# Patient Record
Sex: Male | Born: 1965 | Race: White | Hispanic: No | Marital: Married | Smoking: Never smoker
Health system: Southern US, Community
[De-identification: ages and names within clinical notes are randomized; demographics above are authoritative.]

## PROBLEM LIST (undated history)

## (undated) DIAGNOSIS — I10 Essential (primary) hypertension: Secondary | ICD-10-CM

## (undated) DIAGNOSIS — E119 Type 2 diabetes mellitus without complications: Secondary | ICD-10-CM

## (undated) DIAGNOSIS — F319 Bipolar disorder, unspecified: Secondary | ICD-10-CM

## (undated) DIAGNOSIS — E1122 Type 2 diabetes mellitus with diabetic chronic kidney disease: Secondary | ICD-10-CM

## (undated) DIAGNOSIS — E785 Hyperlipidemia, unspecified: Secondary | ICD-10-CM

## (undated) HISTORY — DX: Type 2 diabetes mellitus without complications: E11.9

## (undated) HISTORY — DX: Type 2 diabetes mellitus with diabetic chronic kidney disease: E11.22

## (undated) HISTORY — DX: Bipolar disorder, unspecified: F31.9

## (undated) HISTORY — DX: Hyperlipidemia, unspecified: E78.5

## (undated) HISTORY — DX: Essential (primary) hypertension: I10

---

## 2006-10-17 ENCOUNTER — Encounter: Admission: RE | Admit: 2006-10-17 | Discharge: 2006-10-17 | Payer: Self-pay | Admitting: Gastroenterology

## 2016-04-05 ENCOUNTER — Encounter (INDEPENDENT_AMBULATORY_CARE_PROVIDER_SITE_OTHER): Payer: Managed Care, Other (non HMO) | Admitting: Ophthalmology

## 2016-04-05 DIAGNOSIS — H2513 Age-related nuclear cataract, bilateral: Secondary | ICD-10-CM

## 2016-04-05 DIAGNOSIS — H35033 Hypertensive retinopathy, bilateral: Secondary | ICD-10-CM | POA: Diagnosis not present

## 2016-04-05 DIAGNOSIS — H43813 Vitreous degeneration, bilateral: Secondary | ICD-10-CM

## 2016-04-05 DIAGNOSIS — I10 Essential (primary) hypertension: Secondary | ICD-10-CM

## 2017-07-08 ENCOUNTER — Encounter: Payer: Managed Care, Other (non HMO) | Attending: Family Medicine | Admitting: Registered"

## 2017-07-08 ENCOUNTER — Encounter: Payer: Self-pay | Admitting: Registered"

## 2017-07-08 DIAGNOSIS — E118 Type 2 diabetes mellitus with unspecified complications: Secondary | ICD-10-CM

## 2017-07-08 DIAGNOSIS — K219 Gastro-esophageal reflux disease without esophagitis: Secondary | ICD-10-CM | POA: Diagnosis not present

## 2017-07-08 DIAGNOSIS — Z713 Dietary counseling and surveillance: Secondary | ICD-10-CM | POA: Diagnosis not present

## 2017-07-08 DIAGNOSIS — E1165 Type 2 diabetes mellitus with hyperglycemia: Secondary | ICD-10-CM | POA: Diagnosis not present

## 2017-07-08 DIAGNOSIS — E785 Hyperlipidemia, unspecified: Secondary | ICD-10-CM | POA: Insufficient documentation

## 2017-07-08 DIAGNOSIS — I10 Essential (primary) hypertension: Secondary | ICD-10-CM | POA: Diagnosis not present

## 2017-07-08 NOTE — Patient Instructions (Addendum)
Regarding supplements you are taking to help give you energy:  Consider asking to have your B12 tested to see if you need to cut back on your vitamin B supplements.  Consider asking to have your vitamin D tested  Good work on changes you have made in your eating patterns such as reducing the amount of chips you are eating!  Plan:   Eat balanced meals and snacks and be sure to include protein when eating carbohydrates.  Aim for 3-5 Carb Choices per meal (45-75 grams)  Aim for 0-2 Carbs choices (0-30 grams) per snack if hungry   Include protein in moderation with your meals and snacks  Consider reading food labels for Total Carbohydrate   Consider increasing your activity  as tolerated with a goal of working up to 3-5 times per week for 30-45 min each time.  Consider checking blood sugar at alternate times per day, fasting and 2 hours after 1 meal per day. If you want to keep a closer eye on your blood sugar, you can consider looking into the [facility] continuous glucose monitoring. Bring your blood glucose log with you to your next visit with me as well as your other health care providers.  Continue taking medication as directed by MD

## 2017-07-08 NOTE — Progress Notes (Signed)
Diabetes Self-Management Education  Visit Type: First/Initial  Appt. Start Time: 1415 Appt. End Time: 1515  07/08/2017  Mr. Tereso Newcomerhomas Doiron, identified by name and date of birth, is a 51 y.o. male with a diagnosis of Diabetes: Type 2.   ASSESSMENT Pt is here for a refresher diabetes education. Pt states he remembers advice to eat whole grains, which he feels he aims to do. Per referral labs A1c was 8.6% March 2018, but is now 10.7% as of October 2018  Pt states several of the vitamins and supplements are to not only address the diabetes but also to increase energy. Pt states he has read the information for his medications and believes that is what is making him tired and taking supplements which he believes will counteract their effect.  Pt states his schedule is very busy taking care of his children's needs as well as his mother (in-law?) who has Alzheimer's. Pt states he eats very late dinners, usually take out, so he can eat with his children when they get home, sometimes not eating dinner until 11 pm and then stay up until 1 am.  Pt states he was checking his BG several years ago but stopped checking because he felt good and believed that as long as he felt good his blood sugar was fine. RD plans to go over symptoms of hypoglycemia and potentially not feeling effects of the hyperglycemia.  Areas which RD would like to cover in subsequent visit include effect of sleep & stress on BG as well as his lipid panel results: Chol: 217 HDL 36 TG 640 NHDL 182   Diabetes Self-Management Education - 07/08/17 1433      Visit Information   Visit Type  First/Initial      Initial Visit   Diabetes Type  Type 2    Are you currently following a meal plan?  Yes    What type of meal plan do you follow?  reduced carbs    Are you taking your medications as prescribed?  Yes metformin, pt states causes heartburn; saxagliptin   metformin, pt states causes heartburn; saxagliptin   Date Diagnosed  5-6 yrs  ago      Health Coping   How would you rate your overall health?  Good      Psychosocial Assessment   Patient Belief/Attitude about Diabetes  Other (comment) frustrated   frustrated   How often do you need to have someone help you when you read instructions, pamphlets, or other written materials from your doctor or pharmacy?  --    What is the last grade level you completed in school?  BME (music education)      Complications   Last HgB A1C per patient/outside source  10.7 % per referral labs 05/2017   per referral labs 05/2017   How often do you check your blood sugar?  0 times/day (not testing)    Have you had a dilated eye exam in the past 12 months?  Yes    Have you had a dental exam in the past 12 months?  Yes    Are you checking your feet?  Yes    How many days per week are you checking your feet?  7      Dietary Intake   Breakfast  protein shake 1 g cho/30 g pro    Snack (morning)  fruit OR nabs whole grain OR nuts OR chex mix  10   10   Lunch  left overs OR sandwich  lean ham, cheese 12-3   12-3   Snack (afternoon)  same as earlier    Sara LeeDinner  take out (was) cookout burgers OR chicken or tuna, OR eggs, meat, cheese, OR salmon patties, green beans, corn, peas OR fish sticks, mixed veggies OR [place] on Sundays 8-9 pm   8-9 pm   Beverage(s)  water with morning meds, diet soda      Exercise   Exercise Type  ADL's    How many days per week to you exercise?  0    How many minutes per day do you exercise?  0    Total minutes per week of exercise  0      Patient Education   Previous Diabetes Education  Yes (please comment) several months after diagnosis, RD within MD practice   several months after diagnosis, RD within MD practice   Nutrition management   Role of diet in the treatment of diabetes and the relationship between the three main macronutrients and blood glucose level;Carbohydrate counting;Food label reading, portion sizes and measuring food.    Physical activity and  exercise   Role of exercise on diabetes management, blood pressure control and cardiac health.    Monitoring  Identified appropriate SMBG and/or A1C goals.      Individualized Goals (developed by patient)   Nutrition  General guidelines for healthy choices and portions discussed    Physical Activity  Exercise 3-5 times per week    Monitoring   test my blood glucose as discussed      Outcomes   Expected Outcomes  Demonstrated interest in learning. Expect positive outcomes    Future DMSE  4-6 wks    Program Status  Not Completed     Individualized Plan for Diabetes Self-Management Training:   Learning Objective:  Patient will have a greater understanding of diabetes self-management. Patient education plan is to attend individual and/or group sessions per assessed needs and concerns.  Patient Instructions  Regarding supplements you are taking to help give you energy:  Consider asking to have your B12 tested to see if you need to cut back on your vitamin B supplements.  Consider asking to have your vitamin D tested  Good work on changes you have made in your eating patterns such as reducing the amount of chips you are eating!  Plan:   Eat balanced meals and snacks and be sure to include protein when eating carbohydrates.  Aim for 3-5 Carb Choices per meal (45-75 grams)  Aim for 0-2 Carbs choices (0-30 grams) per snack if hungry   Include protein in moderation with your meals and snacks  Consider reading food labels for Total Carbohydrate   Consider increasing your activity  as tolerated with a goal of working up to 3-5 times per week for 30-45 min each time.  Consider checking blood sugar at alternate times per day, fasting and 2 hours after 1 meal per day. If you want to keep a closer eye on your blood sugar, you can consider looking into the [facility] continuous glucose monitoring. Bring your blood glucose log with you to your next visit with me as well as your other  health care providers.  Continue taking medication as directed by MD  Expected Outcomes:  Demonstrated interest in learning. Expect positive outcomes  Education material provided: Living Well with Diabetes, Snack sheet and Carbohydrate counting sheet  If problems or questions, patient to contact team via:  Phone  Future DSME appointment: 4-6 wks

## 2017-08-12 ENCOUNTER — Encounter: Payer: Managed Care, Other (non HMO) | Attending: Family Medicine | Admitting: Registered"

## 2017-08-12 DIAGNOSIS — Z713 Dietary counseling and surveillance: Secondary | ICD-10-CM | POA: Insufficient documentation

## 2017-08-12 DIAGNOSIS — I10 Essential (primary) hypertension: Secondary | ICD-10-CM | POA: Insufficient documentation

## 2017-08-12 DIAGNOSIS — E1165 Type 2 diabetes mellitus with hyperglycemia: Secondary | ICD-10-CM | POA: Insufficient documentation

## 2017-08-12 DIAGNOSIS — K219 Gastro-esophageal reflux disease without esophagitis: Secondary | ICD-10-CM | POA: Insufficient documentation

## 2017-08-12 DIAGNOSIS — E785 Hyperlipidemia, unspecified: Secondary | ICD-10-CM | POA: Insufficient documentation

## 2017-08-12 NOTE — Progress Notes (Signed)
Diabetes Self-Management Education  Visit Type: Follow-up  Appt. Start Time: 0935 Appt. End Time: 1015  08/12/2017  [name], identified by name and date of birth, is a 51 y.o. male with a diagnosis of Diabetes:  .   ASSESSMENT Patient returns after starting to check blood sugar. 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 Patient states he is not interested in the CGM at this time because he knows his large dogs will knock it off.  Patient states his numbers are often within target range and would like to understand what is causing some of the elevated morning numbers.   Patient states it is difficult to get adequate sleep because of his dogs who cannot go much more than 5 hours without being walked to be relieved. He usually takes them out around midnight.   Diabetes Self-Management Education - 08/12/17 0900      Visit Information   Visit Type  Follow-up      Complications   How often do you check your blood sugar?  3-4 times/day    Fasting Blood glucose range (mg/dL)  69-629;528-413;[phone];130-179;180-200    Postprandial Blood glucose range (mg/dL)  [phone]    Number of hypoglycemic episodes per month  0    Number of hyperglycemic episodes per week  1      Dietary Intake   Breakfast  protein shake    Snack (morning)  chex mix    Lunch  sandwich, ww bread, lean ham, cheese    Dinner  chicken tenders, collard greens, sweet potatoes    Beverage(s)  water, diet soda      Exercise   Exercise Type  ADL's    How many days per week to you exercise?  0    How many minutes per day do you exercise?  0    Total minutes per week of exercise  0      Patient Education   Nutrition management   Role of diet in the treatment of diabetes and the relationship between the three main macronutrients and blood glucose level    Monitoring  Purpose and frequency of SMBG.      Individualized Goals (developed by patient)   Nutrition  General guidelines for healthy choices and portions discussed    Reducing Risk  increase portions of nuts and seeds;Other (comment) aim to get more sleep      Outcomes   Expected Outcomes  Demonstrated interest in learning. Expect positive outcomes    Future DMSE  PRN    Program Status  Completed      Subsequent Visit   Since your last visit have you continued or begun to take your medications as prescribed?  Yes    Since your last visit have you experienced any weight changes?  Loss    Weight Loss (lbs)  6    Since your last visit, are you checking your blood glucose at least once a day?  Yes     Individualized Plan for Diabetes Self-Management Training:   Learning Objective:  Patient will have a greater understanding of diabetes self-management. Patient education plan is to attend individual and/or group sessions per assessed needs and concerns.   Patient Instructions  You don't need to stay up late to check blood sugar. Aim to get as much sleep as possible Read the article about morning high numbers Consider having less corn/beans/peas with your dinner when  also having bread. Aim to check your blood sugar 2 hours after your first bite.  Regarding supplements you are taking to help give you energy:  Consider asking to have your B12 tested to see if you need to cut back on your vitamin B supplements.  Consider asking to have your vitamin D tested  Expected Outcomes:  Demonstrated interest in learning. Expect positive outcomes  Education material provided: Types of Fat, Cholesterol & Triglycerides, Sleep Hygiene, Causes of Morning High BG  If problems or questions, patient to contact team via:  Phone  Future DSME appointment: PRN

## 2017-08-12 NOTE — Patient Instructions (Addendum)
You don't need to stay up late to check blood sugar. Aim to get as much sleep as possible Read the article about morning high numbers Consider having less corn/beans/peas with your dinner when also having bread. Aim to check your blood sugar 2 hours after your first bite.  Regarding supplements you are taking to help give you energy:  Consider asking to have your B12 tested to see if you need to cut back on your vitamin B supplements.  Consider asking to have your vitamin D tested

## 2017-08-23 ENCOUNTER — Ambulatory Visit: Payer: Managed Care, Other (non HMO) | Admitting: Registered"

## 2017-08-26 ENCOUNTER — Encounter: Payer: Self-pay | Admitting: Registered"

## 2017-08-26 ENCOUNTER — Encounter: Payer: Managed Care, Other (non HMO) | Admitting: Registered"

## 2017-08-26 DIAGNOSIS — I10 Essential (primary) hypertension: Secondary | ICD-10-CM | POA: Diagnosis not present

## 2017-08-26 DIAGNOSIS — Z713 Dietary counseling and surveillance: Secondary | ICD-10-CM | POA: Diagnosis not present

## 2017-08-26 DIAGNOSIS — E785 Hyperlipidemia, unspecified: Secondary | ICD-10-CM | POA: Diagnosis not present

## 2017-08-26 DIAGNOSIS — E1165 Type 2 diabetes mellitus with hyperglycemia: Secondary | ICD-10-CM | POA: Diagnosis present

## 2017-08-26 DIAGNOSIS — E118 Type 2 diabetes mellitus with unspecified complications: Secondary | ICD-10-CM

## 2017-08-26 DIAGNOSIS — K219 Gastro-esophageal reflux disease without esophagitis: Secondary | ICD-10-CM | POA: Diagnosis not present

## 2017-08-26 NOTE — Progress Notes (Signed)
Diabetes Self-Management Education  Visit Type:  Follow up  Appt. Start Time: 1345 Appt. End Time: 1420  08/26/2017  Mr. Tereso Newcomerhomas Mihelich, identified by name and date of birth, is a 51 y.o. male with a diagnosis of Diabetes:  .   ASSESSMENT Patient returns for DSME. Patient provided BG log, taking BG 2x day morning FBG 173-180; 2h PPBG 173-229. Pt states he gets hungry around 10:30, about 3 hrs after breakfast, but if busy may not eat and sometimes will get shaky. Patient reports he does not check his BG because he doesn't want to check more than 2x day. Pt has reported that he is not interested in CGM because his large dogs would knock it off.   Br: protein shake Sn: none or something quick such as chex mix, nuts, or chips Lunch tilapia, guacamole salad, ~10 chips & salsa S: none Dinner: 1 1/2 c pasta, Hawaiian roll, develled egg (a little later) ice cream  Individualized Plan for Diabetes Self-Management Training:  Learning Objective:  Patient will have a greater understanding of diabetes self-management. Patient education plan is to attend individual and/or group sessions per assessed needs and concerns.   Patient Instructions  Quick snack ideas: Hard Boiled eggs, protein packs, greek yogurt. Change when you are checking your blood sugar: Before a meal and then 2 hours after a meal. Find meal combinations that have less 50 mg/dL blood sugar increase. Aim to have regular exercise to help manage stress and blood sugar  Education material provided: Hypoglycemia symptoms/treatment, hyper-hypo symptom overlap  If problems or questions, patient to contact team via:  Phone  Future DSME appointment:  PRN

## 2017-08-26 NOTE — Patient Instructions (Addendum)
Quick snack ideas: Hard Boiled eggs, protein packs, greek yogurt. Change when you are checking your blood sugar: Before a meal and then 2 hours after a meal. Find meal combinations that have less 50 mg/dL blood sugar increase. Aim to have regular exercise to help manage stress and blood sugar

## 2018-06-24 ENCOUNTER — Encounter: Payer: Self-pay | Admitting: Emergency Medicine

## 2018-11-04 ENCOUNTER — Other Ambulatory Visit: Payer: Self-pay | Admitting: Nephrology

## 2018-11-04 DIAGNOSIS — N183 Chronic kidney disease, stage 3 unspecified: Secondary | ICD-10-CM

## 2018-11-13 ENCOUNTER — Ambulatory Visit
Admission: RE | Admit: 2018-11-13 | Discharge: 2018-11-13 | Disposition: A | Discharge status: Home / Self Care | Payer: Managed Care, Other (non HMO) | Source: Ambulatory Visit | Attending: Nephrology | Admitting: Nephrology

## 2018-11-13 DIAGNOSIS — N183 Chronic kidney disease, stage 3 unspecified: Secondary | ICD-10-CM

## 2018-11-24 ENCOUNTER — Other Ambulatory Visit: Payer: Self-pay | Admitting: Psychiatry

## 2018-11-25 ENCOUNTER — Telehealth: Payer: Self-pay | Admitting: Psychiatry

## 2018-11-25 NOTE — Telephone Encounter (Signed)
Already submitted to pharmacy

## 2018-11-25 NOTE — Telephone Encounter (Signed)
Pt called need refill Divalproex ER sent to [facility]

## 2018-11-25 NOTE — Telephone Encounter (Signed)
Need to review paper chart not seen in epic  

## 2019-05-11 ENCOUNTER — Other Ambulatory Visit: Payer: Self-pay | Admitting: Psychiatry

## 2019-05-18 ENCOUNTER — Ambulatory Visit (INDEPENDENT_AMBULATORY_CARE_PROVIDER_SITE_OTHER): Payer: Managed Care, Other (non HMO) | Admitting: Psychiatry

## 2019-05-18 ENCOUNTER — Encounter: Payer: Self-pay | Admitting: Psychiatry

## 2019-05-18 ENCOUNTER — Other Ambulatory Visit: Payer: Self-pay

## 2019-05-18 DIAGNOSIS — F319 Bipolar disorder, unspecified: Secondary | ICD-10-CM | POA: Diagnosis not present

## 2019-05-18 MED ORDER — DIVALPROEX SODIUM ER 500 MG PO TB24: 2000.0000 mg | ORAL_TABLET | Freq: Every day | ORAL | 3 refills | Status: DC

## 2019-05-18 NOTE — Progress Notes (Signed)
Adam Bowman [id number] [birth year] 53 y.o.  Subjective:   Patient ID:  Adam Bowman is a 53 y.o. (DOB 1966/07/24) male.  Chief Complaint:  Chief Complaint  Patient presents with  . Follow-up    Medication Management    HPI Adam Bowman presents to the office today for follow-up of bipolar disorder.  Doing fine with mood.  No illnesses. Remains stable on 2000 mg Depakote daily. No problems with the Depakote.  Wants to have daughters switch to Depakote from lithium bc he's in stage 4 CKD.   Patient reports stable mood and denies depressed or irritable moods.  Patient denies any recent difficulty with anxiety.  Patient denies difficulty with sleep initiation or maintenance except for physical sx. Denies appetite disturbance.  Patient reports that energy and motivation have been good.  Patient denies any difficulty with concentration.  Patient denies any suicidal ideation.  Only problems are kidneys and left arm pain.  Needs to see ortho.  Past Psychiatric Medication Trials:  Lithium, Depakote, perphenazine EPS, Wellbutrin  Review of Systems:  Review of Systems  Genitourinary:       Nocturia  Musculoskeletal: Positive for arthralgias and myalgias.  Neurological: Negative for tremors and weakness.    Medications: I have reviewed the patient's current medications.  Current Outpatient Medications  Medication Sig Dispense Refill  . Ascorbic Acid (VITAMIN C) 1000 MG tablet Take 3,000 mg 2 (two) times daily by mouth.    Marland Kitchen aspirin EC 81 MG tablet Take 81 mg daily by mouth.    Marland Kitchen atorvastatin (LIPITOR) 20 MG tablet Take 20 mg daily by mouth.    Marland Kitchen b complex vitamins tablet Take 1 tablet daily by mouth.    . BEE POLLEN PO Take by mouth.    . Cholecalciferol (VITAMIN D3) 2000 units TABS Take by mouth.    Marland Kitchen CINNAMON PO Take by mouth.    . Coenzyme Q10 (COQ-10 PO) Take by mouth.    . Cyanocobalamin (BL VITAMIN B-12 PO) Take by mouth.    . divalproex (DEPAKOTE ER) 500 MG 24 hr tablet TAKE  ONE TABLET BY MOUTH IN THE MORNING AND THREE TABLETS AT BEDTIME 120 tablet 0  . Fexofenadine HCl (ALLERGY 24-HR PO) Take by mouth.    . fluticasone (FLONASE) 50 MCG/ACT nasal spray Place into both nostrils daily.    Chilton Si Tea 250 MG CAPS Take 2 (two) times daily by mouth.    . GuaiFENesin (MUCINEX PO) Take by mouth daily.     Marland Kitchen JARDIANCE 25 MG TABS tablet 25 mg.    . KRILL OIL PO Take 2 (two) times daily by mouth.    . losartan (COZAAR) 50 MG tablet Take 50 mg daily by mouth.    . metFORMIN (GLUCOPHAGE) 500 MG tablet Take 1,000 mg by mouth 2 (two) times daily with a meal. 500 in the AM and 1000 in the evening.    . Misc Natural Products (JOINT SUPPORT COMPLEX PO) Take by mouth.    . Multiple Vitamin (MULTIVITAMIN WITH MINERALS) TABS tablet Take 1 tablet daily by mouth.    . multivitamin-lutein (OCUVITE-LUTEIN) CAPS capsule Take 1 capsule by mouth daily.    . Omega-3 Fatty Acids (FISH OIL) 1000 MG CAPS Take 3 capsules 2 (two) times daily by mouth.    . pantoprazole (PROTONIX) 40 MG tablet Take 40 mg daily by mouth.    Marland Kitchen Specialty Vitamins Products (PROSTATE PO) Take by mouth.    . TESTOSTERONE UNDECANOATE PO Take  2 tablets by mouth daily.     No current facility-administered medications for this visit.     Medication Side Effects: None  Allergies:  Allergies  Allergen Reactions  . Codeine Swelling    Face numbness   . Fenofibrate Swelling    Legs turn red and swell  . Ibuprofen     Stage 4 Kidney failure. Cannot take.  Marland Kitchen Procardia [Nifedipine] Swelling  . Sulfa Antibiotics Swelling    History reviewed. No pertinent past medical history.  History reviewed. No pertinent family history.  Social History   Socioeconomic History  . Marital status: Married    Spouse name: Not on file  . Number of children: Not on file  . Years of education: Not on file  . Highest education level: Not on file  Occupational History  . Not on file  Social Needs  . Financial resource strain:  Not on file  . Food insecurity    Worry: Not on file    Inability: Not on file  . Transportation needs    Medical: Not on file    Non-medical: Not on file  Tobacco Use  . Smoking status: Never Smoker  . Smokeless tobacco: Never Used  Substance and Sexual Activity  . Alcohol use: Not on file  . Drug use: Not on file  . Sexual activity: Not on file  Lifestyle  . Physical activity    Days per week: Not on file    Minutes per session: Not on file  . Stress: Not on file  Relationships  . Social Herbalist on phone: Not on file    Gets together: Not on file    Attends religious service: Not on file    Active member of club or organization: Not on file    Attends meetings of clubs or organizations: Not on file    Relationship status: Not on file  . Intimate partner violence    Fear of current or ex partner: Not on file    Emotionally abused: Not on file    Physically abused: Not on file    Forced sexual activity: Not on file  Other Topics Concern  . Not on file  Social History Narrative  . Not on file    Past Medical History, Surgical history, Social history, and Family history were reviewed and updated as appropriate.   Please see review of systems for further details on the patient's review from today.   Objective:   Physical Exam:  There were no vitals taken for this visit.  Physical Exam Constitutional:      General: He is not in acute distress.    Appearance: He is well-developed. He is obese.  Musculoskeletal:        General: No deformity.  Neurological:     Mental Status: He is alert and oriented to person, place, and time.     Coordination: Coordination normal.  Psychiatric:        Attention and Perception: Attention and perception normal. He does not perceive auditory or visual hallucinations.        Mood and Affect: Mood normal. Mood is not anxious or depressed. Affect is not labile, blunt, angry or inappropriate.        Speech: Speech normal.         Behavior: Behavior normal.        Thought Content: Thought content normal. Thought content does not include homicidal or suicidal ideation. Thought content does not include homicidal  or suicidal plan.        Cognition and Memory: Cognition and memory normal.        Judgment: Judgment normal.     Comments: Insight intact. No delusions.      Lab Review:  No results found for: NA, K, CL, CO2, GLUCOSE, BUN, CREATININE, CALCIUM, PROT, ALBUMIN, AST, ALT, ALKPHOS, BILITOT, GFRNONAA, GFRAA  No results found for: WBC, RBC, HGB, HCT, PLT, MCV, MCH, MCHC, RDW, LYMPHSABS, MONOABS, EOSABS, BASOSABS  No results found for: POCLITH, LITHIUM   No results found for: PHENYTOIN, PHENOBARB, VALPROATE, CBMZ   .res Assessment: Plan:    Maisie Fushomas was seen today for follow-up.  Diagnoses and all orders for this visit:  Bipolar I disorder (HCC)  supportive therapy for longterm stability.  Aware of high relapse risk off the meds.  He agrees to continue. Disc concerns about longterm lithium in the past contributing to his CKD.  Continue Depakote ER 500 mg in the AM and 1500 mg at night.  He agrees.   FU 1 year bc longterm stability.  Meredith Staggersarey Cottle, MD, DFAPA   Please see After Visit Summary for patient specific instructions.  No future appointments.  No orders of the defined types were placed in this encounter.   -------------------------------

## 2019-11-15 IMAGING — US RENAL/URINARY TRACT ULTRASOUND
1 series · 14 of 25 positions shown · non-contrast
Comparison: None.

CLINICAL DATA: 52-year-old male with chronic kidney disease

EXAM:
RENAL / URINARY TRACT ULTRASOUND COMPLETE

[Series 1: renal/urinary tract ultrasound · 0.22mm/px · 14 of 46 slices shown]
[im 1/46]
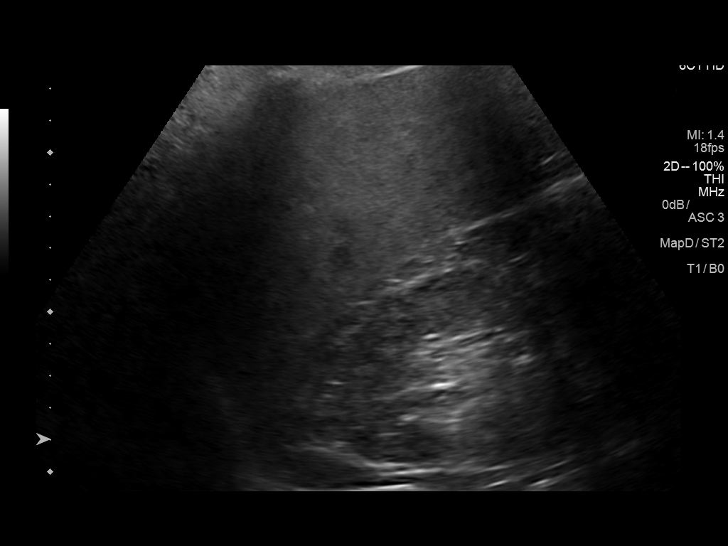
[im 4/46]
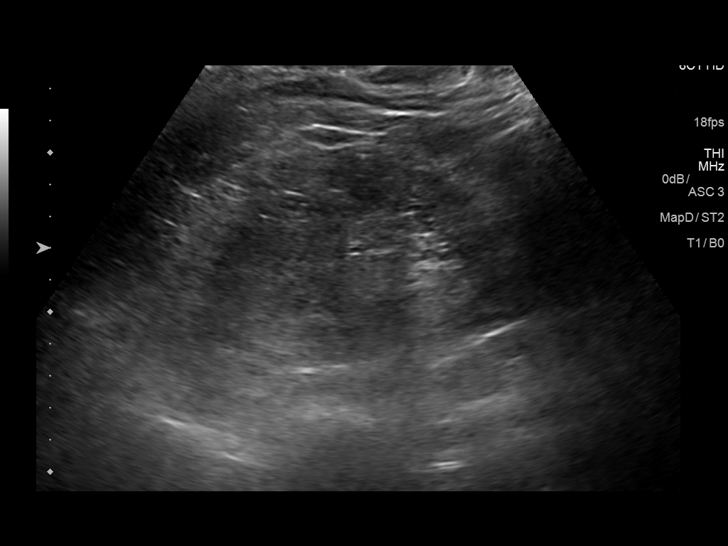
[im 8/46]
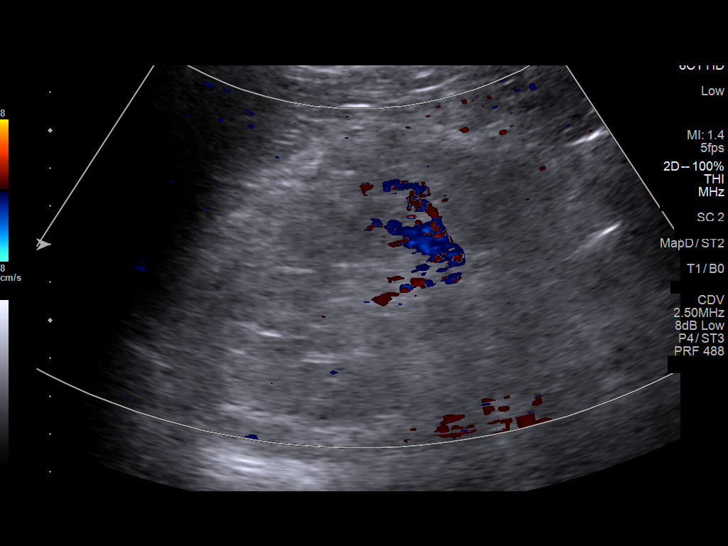
[im 12/46]
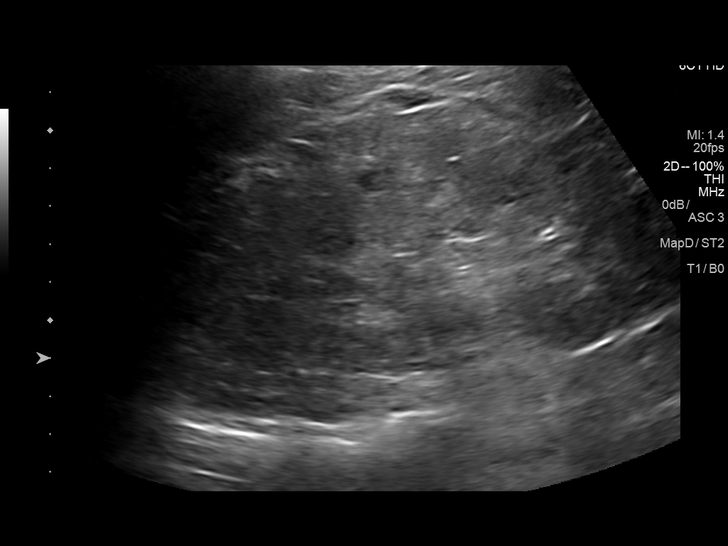
[im 16/46]
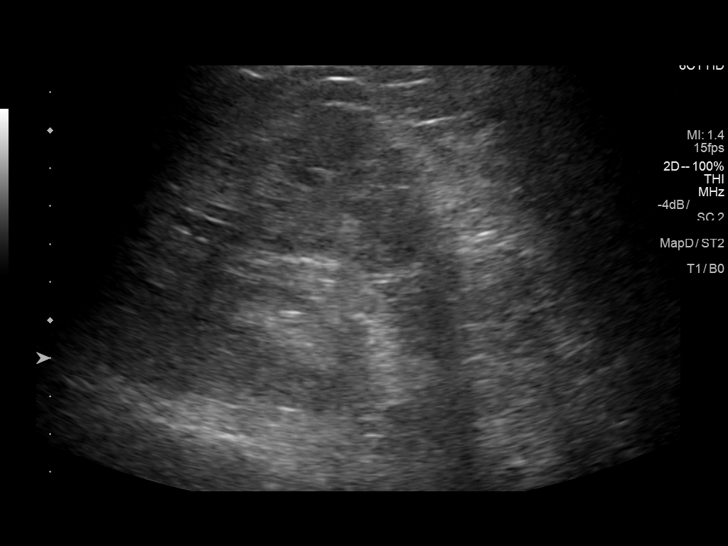
[im 17/46]
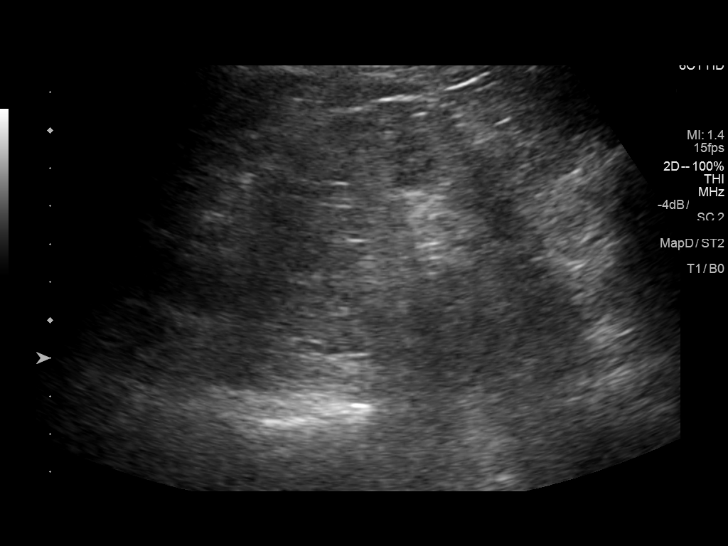
[im 21/46]
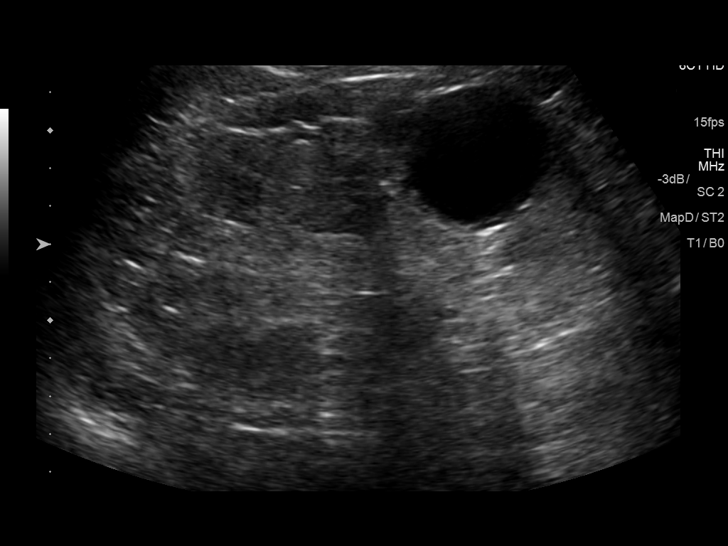
[im 25/46]
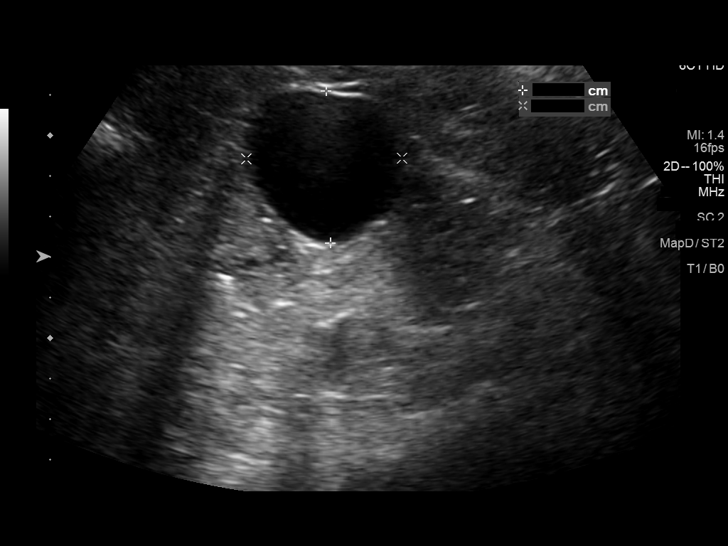
[im 29/46]
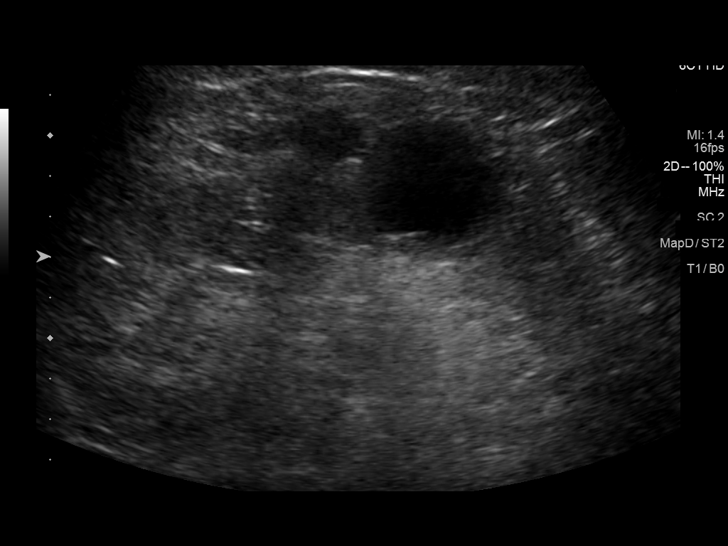
[im 31/46]
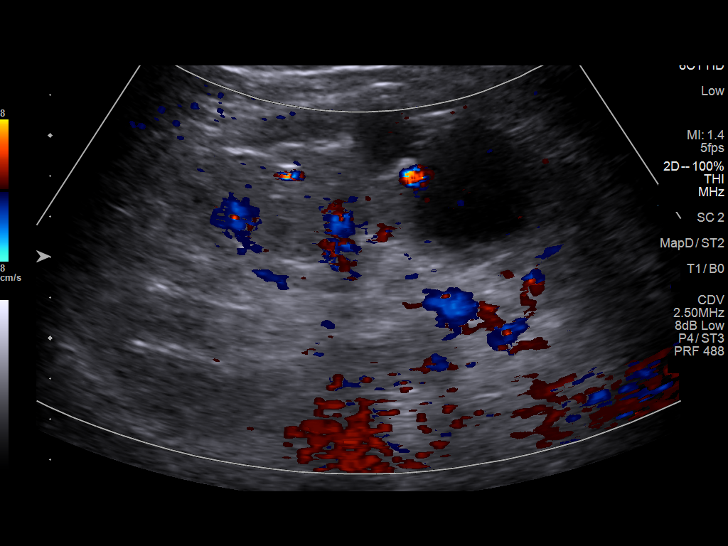
[im 34/46]
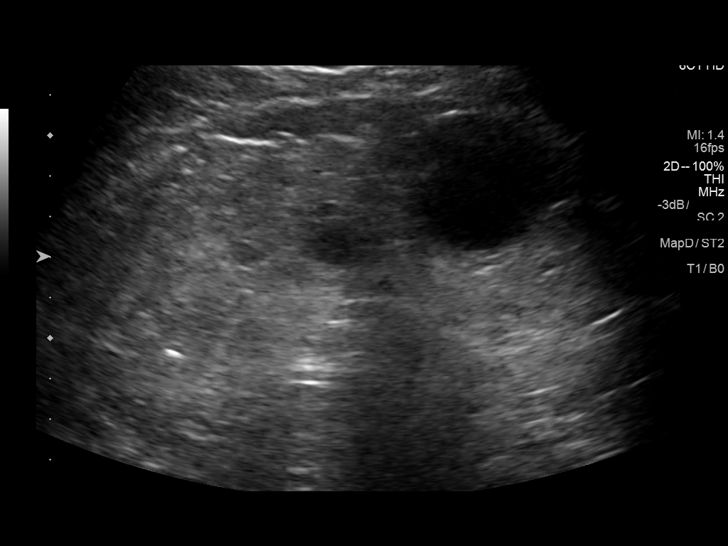
[im 38/46]
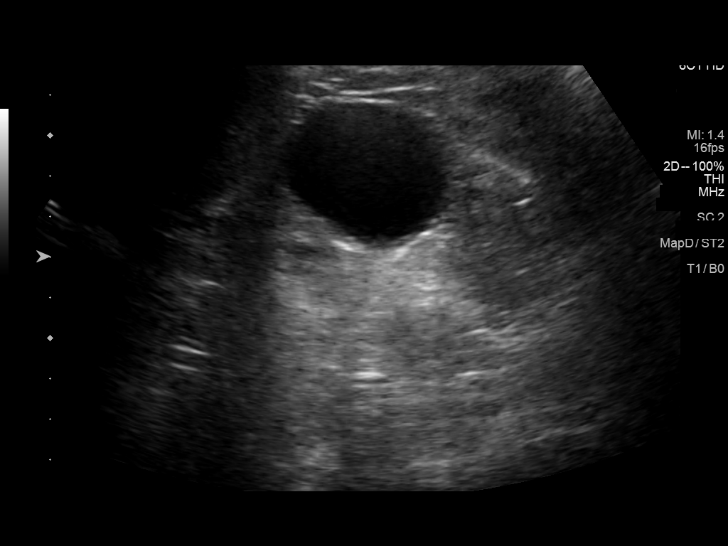
[im 42/46]
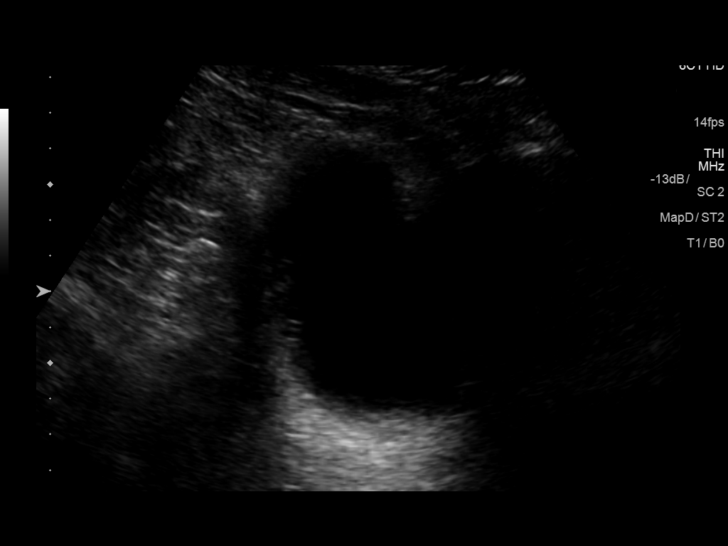
[im 46/46]
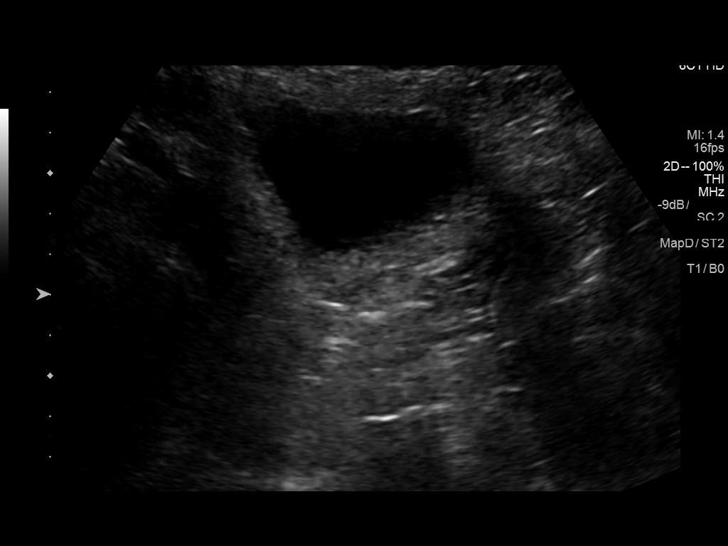

[14 of 25 positions shown; findings below may reference images not displayed]

FINDINGS: Right Kidney:

Length: 10.8 cm x 6.7 cm x 5.3 cm, 197 cc. Echogenicity within
normal limits. No mass or hydronephrosis visualized.

Left Kidney:

Length: 12.9 cm x 7.4 cm x 5.8 cm, 291 cc. Echogenicity within
normal limits. No mass or hydronephrosis visualized. Anechoic cystic
lesions of the left kidney with through transmission and no internal
complexity. One measures 3.8 cm in greatest diameter and 1 measures
1.9 cm in greatest diameter.

Bladder:

Appears normal for degree of bladder distention.
IMPRESSION: No evidence of hydronephrosis.

Left-sided Bosniak 1 cysts.

## 2020-05-16 ENCOUNTER — Other Ambulatory Visit: Payer: Self-pay

## 2020-05-16 ENCOUNTER — Ambulatory Visit (INDEPENDENT_AMBULATORY_CARE_PROVIDER_SITE_OTHER): Payer: 59 | Admitting: Psychiatry

## 2020-05-16 ENCOUNTER — Encounter: Payer: Self-pay | Admitting: Psychiatry

## 2020-05-16 DIAGNOSIS — F319 Bipolar disorder, unspecified: Secondary | ICD-10-CM | POA: Diagnosis not present

## 2020-05-16 MED ORDER — DIVALPROEX SODIUM ER 500 MG PO TB24: 2000.0000 mg | ORAL_TABLET | Freq: Every day | ORAL | 3 refills | Status: DC
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
## 2021-01-03 ENCOUNTER — Emergency Department (HOSPITAL_COMMUNITY): Payer: 59

## 2021-01-03 ENCOUNTER — Inpatient Hospital Stay (HOSPITAL_COMMUNITY)
Admission: EM | Admit: 2021-01-03 | Discharge: 2021-01-07 | DRG: 603 | Disposition: A | Discharge status: Home / Self Care | Payer: 59 | Attending: Internal Medicine | Admitting: Internal Medicine

## 2021-01-03 ENCOUNTER — Other Ambulatory Visit: Payer: Self-pay

## 2021-01-03 DIAGNOSIS — I10 Essential (primary) hypertension: Secondary | ICD-10-CM | POA: Diagnosis not present

## 2021-01-03 DIAGNOSIS — I129 Hypertensive chronic kidney disease with stage 1 through stage 4 chronic kidney disease, or unspecified chronic kidney disease: Secondary | ICD-10-CM | POA: Diagnosis present

## 2021-01-03 DIAGNOSIS — E1121 Type 2 diabetes mellitus with diabetic nephropathy: Secondary | ICD-10-CM | POA: Diagnosis not present

## 2021-01-03 DIAGNOSIS — K219 Gastro-esophageal reflux disease without esophagitis: Secondary | ICD-10-CM | POA: Diagnosis present

## 2021-01-03 DIAGNOSIS — Z7982 Long term (current) use of aspirin: Secondary | ICD-10-CM

## 2021-01-03 DIAGNOSIS — W540XXA Bitten by dog, initial encounter: Secondary | ICD-10-CM | POA: Diagnosis not present

## 2021-01-03 DIAGNOSIS — Z79899 Other long term (current) drug therapy: Secondary | ICD-10-CM | POA: Diagnosis not present

## 2021-01-03 DIAGNOSIS — L03113 Cellulitis of right upper limb: Principal | ICD-10-CM | POA: Diagnosis present

## 2021-01-03 DIAGNOSIS — Z888 Allergy status to other drugs, medicaments and biological substances status: Secondary | ICD-10-CM | POA: Diagnosis not present

## 2021-01-03 DIAGNOSIS — A280 Pasteurellosis: Secondary | ICD-10-CM | POA: Diagnosis present

## 2021-01-03 DIAGNOSIS — E785 Hyperlipidemia, unspecified: Secondary | ICD-10-CM | POA: Diagnosis present

## 2021-01-03 DIAGNOSIS — N1832 Chronic kidney disease, stage 3b: Secondary | ICD-10-CM | POA: Diagnosis present

## 2021-01-03 DIAGNOSIS — Z833 Family history of diabetes mellitus: Secondary | ICD-10-CM | POA: Diagnosis not present

## 2021-01-03 DIAGNOSIS — J309 Allergic rhinitis, unspecified: Secondary | ICD-10-CM | POA: Diagnosis present

## 2021-01-03 DIAGNOSIS — E1122 Type 2 diabetes mellitus with diabetic chronic kidney disease: Secondary | ICD-10-CM | POA: Diagnosis present

## 2021-01-03 DIAGNOSIS — W540XXS Bitten by dog, sequela: Secondary | ICD-10-CM | POA: Diagnosis not present

## 2021-01-03 DIAGNOSIS — Z886 Allergy status to analgesic agent status: Secondary | ICD-10-CM

## 2021-01-03 DIAGNOSIS — W540XXD Bitten by dog, subsequent encounter: Secondary | ICD-10-CM | POA: Diagnosis not present

## 2021-01-03 DIAGNOSIS — G40909 Epilepsy, unspecified, not intractable, without status epilepticus: Secondary | ICD-10-CM | POA: Diagnosis present

## 2021-01-03 DIAGNOSIS — Z21 Asymptomatic human immunodeficiency virus [HIV] infection status: Secondary | ICD-10-CM

## 2021-01-03 DIAGNOSIS — Z7984 Long term (current) use of oral hypoglycemic drugs: Secondary | ICD-10-CM

## 2021-01-03 DIAGNOSIS — Z20822 Contact with and (suspected) exposure to covid-19: Secondary | ICD-10-CM | POA: Diagnosis present

## 2021-01-03 DIAGNOSIS — Z885 Allergy status to narcotic agent status: Secondary | ICD-10-CM

## 2021-01-03 DIAGNOSIS — Z79890 Hormone replacement therapy (postmenopausal): Secondary | ICD-10-CM | POA: Diagnosis not present

## 2021-01-03 DIAGNOSIS — B9689 Other specified bacterial agents as the cause of diseases classified elsewhere: Secondary | ICD-10-CM | POA: Diagnosis present

## 2021-01-03 DIAGNOSIS — T148XXA Other injury of unspecified body region, initial encounter: Secondary | ICD-10-CM | POA: Diagnosis not present

## 2021-01-03 DIAGNOSIS — Z22322 Carrier or suspected carrier of Methicillin resistant Staphylococcus aureus: Secondary | ICD-10-CM

## 2021-01-03 DIAGNOSIS — F32A Depression, unspecified: Secondary | ICD-10-CM | POA: Diagnosis present

## 2021-01-03 DIAGNOSIS — N183 Chronic kidney disease, stage 3 unspecified: Secondary | ICD-10-CM | POA: Diagnosis present

## 2021-01-03 DIAGNOSIS — Z8249 Family history of ischemic heart disease and other diseases of the circulatory system: Secondary | ICD-10-CM | POA: Diagnosis not present

## 2021-01-03 DIAGNOSIS — L089 Local infection of the skin and subcutaneous tissue, unspecified: Secondary | ICD-10-CM | POA: Diagnosis not present

## 2021-01-03 DIAGNOSIS — Z882 Allergy status to sulfonamides status: Secondary | ICD-10-CM

## 2021-01-03 LAB — COMPREHENSIVE METABOLIC PANEL
ALT: 33 U/L (ref 0–44)
AST: 30 U/L (ref 15–41)
Albumin: 3.5 g/dL (ref 3.5–5.0)
Alkaline Phosphatase: 64 U/L (ref 38–126)
Anion gap: 14 (ref 5–15)
BUN: 40 mg/dL — ABNORMAL HIGH (ref 6–20)
CO2: 18 mmol/L — ABNORMAL LOW (ref 22–32)
Calcium: 9.6 mg/dL (ref 8.9–10.3)
Chloride: 106 mmol/L (ref 98–111)
Creatinine, Ser: 2.35 mg/dL — ABNORMAL HIGH (ref 0.61–1.24)
GFR, Estimated: 32 mL/min — ABNORMAL LOW (ref >60)
Glucose, Bld: 200 mg/dL — ABNORMAL HIGH (ref 70–99)
Potassium: 4 mmol/L (ref 3.5–5.1)
Sodium: 138 mmol/L (ref 135–145)
Total Bilirubin: 0.7 mg/dL (ref 0.3–1.2)
Total Protein: 7.6 g/dL (ref 6.5–8.1)

## 2021-01-03 LAB — PROTIME-INR
INR: 0.9 (ref 0.8–1.2)
Prothrombin Time: 12.6 seconds (ref 11.4–15.2)

## 2021-01-03 LAB — RESP PANEL BY RT-PCR (FLU A&B, COVID) ARPGX2
Influenza A by PCR: NEGATIVE
Influenza B by PCR: NEGATIVE
SARS Coronavirus 2 by RT PCR: NEGATIVE

## 2021-01-03 LAB — CBC WITH DIFFERENTIAL/PLATELET
Abs Immature Granulocytes: 0.02 10*3/uL (ref 0.00–0.07)
Basophils Absolute: 0 10*3/uL (ref 0.0–0.1)
Basophils Relative: 1 %
Eosinophils Absolute: 0.1 10*3/uL (ref 0.0–0.5)
Eosinophils Relative: 1 %
HCT: 38.4 % — ABNORMAL LOW (ref 39.0–52.0)
Hemoglobin: 12.8 g/dL — ABNORMAL LOW (ref 13.0–17.0)
Immature Granulocytes: 0 %
Lymphocytes Relative: 29 %
Lymphs Abs: 1.8 10*3/uL (ref 0.7–4.0)
MCH: 32.8 pg (ref 26.0–34.0)
MCHC: 33.3 g/dL (ref 30.0–36.0)
MCV: 98.5 fL (ref 80.0–100.0)
Monocytes Absolute: 0.6 10*3/uL (ref 0.1–1.0)
Monocytes Relative: 10 %
Neutro Abs: 3.7 10*3/uL (ref 1.7–7.7)
Neutrophils Relative %: 59 %
Platelets: 175 10*3/uL (ref 150–400)
RBC: 3.9 MIL/uL — ABNORMAL LOW (ref 4.22–5.81)
RDW: 13 % (ref 11.5–15.5)
WBC: 6.2 10*3/uL (ref 4.0–10.5)
nRBC: 0 % (ref 0.0–0.2)

## 2021-01-03 LAB — GLUCOSE, CAPILLARY
Glucose-Capillary: 144 mg/dL — ABNORMAL HIGH (ref 70–99)
Glucose-Capillary: 177 mg/dL — ABNORMAL HIGH (ref 70–99)

## 2021-01-03 LAB — HEMOGLOBIN A1C
Hgb A1c MFr Bld: 8.3 % — ABNORMAL HIGH (ref 4.8–5.6)
Mean Plasma Glucose: 191.51 mg/dL

## 2021-01-03 LAB — LACTIC ACID, PLASMA
Lactic Acid, Venous: 1.6 mmol/L (ref 0.5–1.9)
Lactic Acid, Venous: 1.4 mmol/L (ref 0.5–1.9)

## 2021-01-03 LAB — PHOSPHORUS: Phosphorus: 3.6 mg/dL (ref 2.5–4.6)

## 2021-01-03 LAB — ABO/RH: ABO/RH(D): O POS

## 2021-01-03 LAB — HIV ANTIBODY (ROUTINE TESTING W REFLEX): HIV Screen 4th Generation wRfx: REACTIVE — AB

## 2021-01-03 LAB — TYPE AND SCREEN
ABO/RH(D): O POS
Antibody Screen: NEGATIVE

## 2021-01-03 LAB — TSH: TSH: 1.248 u[IU]/mL (ref 0.350–4.500)

## 2021-01-03 LAB — MAGNESIUM: Magnesium: 2.2 mg/dL (ref 1.7–2.4)

## 2021-01-03 MED ORDER — PANTOPRAZOLE SODIUM 40 MG PO TBEC
40.0000 mg | DELAYED_RELEASE_TABLET | Freq: Every day | ORAL | Status: DC
  Filled 2021-01-03 (×3): qty 1

## 2021-01-03 MED ORDER — INSULIN ASPART 100 UNIT/ML IJ SOLN
0.0000 [IU] | Freq: Every day | INTRAMUSCULAR | Status: DC
  Filled 2021-01-03: qty 0.05

## 2021-01-03 MED ORDER — KETOROLAC TROMETHAMINE 30 MG/ML IJ SOLN
30.0000 mg | Freq: Three times a day (TID) | INTRAMUSCULAR | Status: AC
  Administered 2021-01-03 – 2021-01-04 (×2): 30 mg via INTRAVENOUS
  Filled 2021-01-03 (×2): qty 1

## 2021-01-03 MED ORDER — INSULIN DETEMIR 100 UNIT/ML ~~LOC~~ SOLN
10.0000 [IU] | Freq: Every day | SUBCUTANEOUS | Status: DC
  Administered 2021-01-04 – 2021-01-06 (×3): 10 [IU] via SUBCUTANEOUS
  Filled 2021-01-03 (×5): qty 0.1

## 2021-01-03 MED ORDER — SODIUM CHLORIDE 0.9 % IV SOLN
2.0000 g | Freq: Once | INTRAVENOUS | Status: AC
  Administered 2021-01-03: 2 g via INTRAVENOUS
  Filled 2021-01-03: qty 20

## 2021-01-03 MED ORDER — ONDANSETRON HCL 4 MG/2ML IJ SOLN: 4.0000 mg | Freq: Four times a day (QID) | INTRAMUSCULAR | Status: DC | PRN

## 2021-01-03 MED ORDER — SODIUM CHLORIDE 0.9 % IV SOLN: 1.5000 g | Freq: Once | INTRAVENOUS | Status: DC

## 2021-01-03 MED ORDER — ACETAMINOPHEN 325 MG PO TABS: 650.0000 mg | ORAL_TABLET | Freq: Four times a day (QID) | ORAL | Status: DC | PRN

## 2021-01-03 MED ORDER — ATORVASTATIN CALCIUM 20 MG PO TABS
20.0000 mg | ORAL_TABLET | Freq: Every day | ORAL | Status: DC
  Administered 2021-01-04 – 2021-01-07 (×4): 20 mg via ORAL
  Filled 2021-01-03 (×4): qty 1

## 2021-01-03 MED ORDER — PROSIGHT PO TABS
1.0000 | ORAL_TABLET | Freq: Every evening | ORAL | Status: DC
  Administered 2021-01-03 – 2021-01-06 (×4): 1 via ORAL
  Filled 2021-01-03 (×4): qty 1

## 2021-01-03 MED ORDER — DIVALPROEX SODIUM ER 500 MG PO TB24
1500.0000 mg | ORAL_TABLET | Freq: Every day | ORAL | Status: DC
Start: 2021-01-04 — End: 2021-01-07
  Administered 2021-01-03 – 2021-01-06 (×4): 1500 mg via ORAL
  Filled 2021-01-03 (×4): qty 3

## 2021-01-03 MED ORDER — INSULIN ASPART 100 UNIT/ML IJ SOLN
0.0000 [IU] | Freq: Three times a day (TID) | INTRAMUSCULAR | Status: DC
  Administered 2021-01-03 – 2021-01-04 (×2): 2 [IU] via SUBCUTANEOUS
  Administered 2021-01-04: 5 [IU] via SUBCUTANEOUS
  Administered 2021-01-04: 2 [IU] via SUBCUTANEOUS
  Administered 2021-01-05: 5 [IU] via SUBCUTANEOUS
  Administered 2021-01-05: 2 [IU] via SUBCUTANEOUS
  Administered 2021-01-05: 5 [IU] via SUBCUTANEOUS
  Administered 2021-01-06 – 2021-01-07 (×3): 3 [IU] via SUBCUTANEOUS
  Filled 2021-01-03: qty 0.15

## 2021-01-03 MED ORDER — LACTATED RINGERS IV BOLUS
1000.0000 mL | Freq: Once | INTRAVENOUS | Status: AC
  Administered 2021-01-03: 1000 mL via INTRAVENOUS

## 2021-01-03 MED ORDER — VANCOMYCIN HCL IN DEXTROSE 1-5 GM/200ML-% IV SOLN: 1000.0000 mg | Freq: Once | INTRAVENOUS | Status: DC

## 2021-01-03 MED ORDER — OMEGA-3-ACID ETHYL ESTERS 1 G PO CAPS
2.0000 g | ORAL_CAPSULE | Freq: Two times a day (BID) | ORAL | Status: DC
  Administered 2021-01-03 – 2021-01-07 (×8): 2 g via ORAL
  Filled 2021-01-03 (×7): qty 2

## 2021-01-03 MED ORDER — CLINDAMYCIN PHOSPHATE 600 MG/50ML IV SOLN
600.0000 mg | Freq: Once | INTRAVENOUS | Status: AC
  Administered 2021-01-03: 600 mg via INTRAVENOUS
  Filled 2021-01-03: qty 50

## 2021-01-03 MED ORDER — ONDANSETRON HCL 4 MG PO TABS: 4.0000 mg | ORAL_TABLET | Freq: Four times a day (QID) | ORAL | Status: DC | PRN

## 2021-01-03 MED ORDER — LOSARTAN POTASSIUM 50 MG PO TABS
50.0000 mg | ORAL_TABLET | Freq: Every day | ORAL | Status: DC
  Administered 2021-01-04 – 2021-01-07 (×4): 50 mg via ORAL
  Filled 2021-01-03 (×4): qty 1

## 2021-01-03 MED ORDER — FLUTICASONE PROPIONATE 50 MCG/ACT NA SUSP
1.0000 | Freq: Every day | NASAL | Status: DC
  Administered 2021-01-06 – 2021-01-07 (×2): 1 via NASAL
  Filled 2021-01-03: qty 16

## 2021-01-03 MED ORDER — FEXOFENADINE HCL 180 MG PO TABS
180.0000 mg | ORAL_TABLET | Freq: Every day | ORAL | Status: DC
  Administered 2021-01-04 – 2021-01-07 (×4): 180 mg via ORAL
  Filled 2021-01-03 (×4): qty 1

## 2021-01-03 MED ORDER — EMPAGLIFLOZIN 25 MG PO TABS: 25.0000 mg | ORAL_TABLET | Freq: Every day | ORAL | Status: DC

## 2021-01-03 MED ORDER — MORPHINE SULFATE (PF) 2 MG/ML IV SOLN: 2.0000 mg | INTRAVENOUS | Status: DC | PRN

## 2021-01-03 MED ORDER — DIVALPROEX SODIUM ER 500 MG PO TB24
500.0000 mg | ORAL_TABLET | Freq: Every day | ORAL | Status: DC
  Administered 2021-01-04 – 2021-01-07 (×4): 500 mg via ORAL
  Filled 2021-01-03 (×4): qty 1

## 2021-01-03 MED ORDER — ASPIRIN EC 81 MG PO TBEC
81.0000 mg | DELAYED_RELEASE_TABLET | Freq: Every day | ORAL | Status: DC
  Administered 2021-01-04 – 2021-01-07 (×4): 81 mg via ORAL
  Filled 2021-01-03 (×4): qty 1

## 2021-01-03 MED ORDER — ACETAMINOPHEN 650 MG RE SUPP: 650.0000 mg | Freq: Four times a day (QID) | RECTAL | Status: DC | PRN

## 2021-01-03 MED ORDER — HEPARIN SODIUM (PORCINE) 5000 UNIT/ML IJ SOLN: 5000.0000 [IU] | Freq: Three times a day (TID) | INTRAMUSCULAR | Status: DC

## 2021-01-03 MED ORDER — SODIUM CHLORIDE 0.9 % IV SOLN
3.0000 g | Freq: Four times a day (QID) | INTRAVENOUS | Status: DC
  Administered 2021-01-03 – 2021-01-05 (×7): 3 g via INTRAVENOUS
  Filled 2021-01-03: qty 3
  Filled 2021-01-03: qty 8
  Filled 2021-01-03 (×5): qty 3

## 2021-01-03 MED ORDER — LORAZEPAM 2 MG/ML IJ SOLN: 1.0000 mg | Freq: Once | INTRAMUSCULAR | Status: DC | PRN

## 2021-01-03 NOTE — Consult Note (Addendum)
ORTHOPAEDIC CONSULTATION HISTORY & PHYSICAL REQUESTING PHYSICIAN: Vassie Loll, MD  Chief Complaint: right forearm dog bite  HPI: Adam Bowman is a 55 y.o. male diabetic who sustained R FA dog bite at the end of last week. He as been caring for the bite wounds with soap/water cleansing, peroxide, and alcohol, but went to PCP today because of change in odor.  PCP referred patient to ED.  I reviewed the clinical history and clinical photographs while I was in the office, and recommended that Dr. Clarice Pole spread the concerning bite wound under local anesthesia to ensure adequate drainage was present and established before he was admitted to the floor on IV antibiotics, but she declined to employ this recommendation.  No past medical history on file. No past surgical history on file. Social History   Socioeconomic History  . Marital status: Married    Spouse name: Not on file  . Number of children: Not on file  . Years of education: Not on file  . Highest education level: Not on file  Occupational History  . Not on file  Tobacco Use  . Smoking status: Never Smoker  . Smokeless tobacco: Never Used  Substance and Sexual Activity  . Alcohol use: Not on file  . Drug use: Not on file  . Sexual activity: Not on file  Other Topics Concern  . Not on file  Social History Narrative  . Not on file   Social Determinants of Health   Financial Resource Strain: Not on file  Food Insecurity: Not on file  Transportation Needs: Not on file  Physical Activity: Not on file  Stress: Not on file  Social Connections: Not on file   No family history on file. Allergies  Allergen Reactions  . Codeine Swelling    Face numbness   . Fenofibrate Swelling    Legs turn red and swell  . Ibuprofen     Stage 4 Kidney failure. Cannot take.  Marland Kitchen Procardia [Nifedipine] Swelling  . Sulfa Antibiotics Swelling   Prior to Admission medications   Medication Sig Start Date End Date Taking? Authorizing  Provider  Ascorbic Acid (VITAMIN C) 500 MG CHEW Chew 4,000 mg by mouth in the morning and at bedtime. Take 8 tablets BID   Yes [provider]  aspirin EC 81 MG tablet Take 81 mg daily by mouth.   Yes [provider]  atorvastatin (LIPITOR) 20 MG tablet Take 20 mg daily by mouth.   Yes [provider]  b complex vitamins tablet Take 1 tablet by mouth at bedtime.   Yes [provider]  BEE POLLEN PO Take 1 capsule by mouth in the morning and at bedtime.   Yes [provider]  Cholecalciferol (VITAMIN D3) 2000 units TABS Take 4,000 Units by mouth at bedtime.   Yes [provider]  CINNAMON PO Take 2 capsules by mouth in the morning and at bedtime.   Yes [provider]  Coenzyme Q10 (COQ-10 PO) Take 1 capsule by mouth daily.   Yes [provider]  Cyanocobalamin (BL VITAMIN B-12 PO) Take 1 tablet by mouth daily.   Yes [provider]  divalproex (DEPAKOTE ER) 500 MG 24 hr tablet Take 4 tablets (2,000 mg total) by mouth daily. Patient taking differently: Take 500-1,500 mg by mouth in the morning and at bedtime. Take 1 tablet (500 mg) in the morning & Take 3 tablets (1500 mg) at bedtime 05/16/20  Yes Cottle, Steva Ready., MD  Fexofenadine  HCl (ALLERGY 24-HR PO) Take 1 tablet by mouth daily.   Yes [provider]  fluticasone (FLONASE) 50 MCG/ACT nasal spray Place 1 spray into both nostrils daily.   Yes [provider]  furosemide (LASIX) 20 MG tablet Take 20 mg by mouth daily. 05/04/20  Yes [provider]  Chilton Si Tea 250 MG CAPS Take 1 capsule by mouth 2 (two) times daily.   Yes [provider]  GuaiFENesin (MUCINEX PO) Take 1 tablet by mouth daily.   Yes [provider]  JARDIANCE 25 MG TABS tablet Take 25 mg by mouth daily. 05/11/19  Yes [provider]  KRILL OIL PO Take 1 capsule by mouth 2 (two) times daily.   Yes [provider]  losartan (COZAAR) 50 MG  tablet Take 50 mg daily by mouth.   Yes [provider]  metFORMIN (GLUCOPHAGE-XR) 500 MG 24 hr tablet Take 1,000 mg by mouth 2 (two) times daily. 12/14/20  Yes [provider]  Misc Natural Products (JOINT SUPPORT COMPLEX PO) Take 1 capsule by mouth in the morning.   Yes [provider]  Misc Natural Products (PROSTATE) CAPS Take 1 capsule by mouth in the morning.   Yes [provider]  Multiple Vitamin (MULTIVITAMIN WITH MINERALS) TABS tablet Take 1 tablet daily by mouth.   Yes [provider]  multivitamin-lutein (OCUVITE-LUTEIN) CAPS capsule Take 1 capsule by mouth every evening.   Yes [provider]  Omega-3 Fatty Acids (FISH OIL) 1000 MG CAPS Take 3 capsules 2 (two) times daily by mouth.   Yes [provider]  pantoprazole (PROTONIX) 40 MG tablet Take 40 mg daily by mouth.   Yes [provider]  TESTOSTERONE UNDECANOATE PO Take 2 capsules by mouth daily.   Yes [provider]  Turmeric 500 MG CAPS Take 1,000 mg by mouth in the morning.   Yes [provider]  pioglitazone (ACTOS) 15 MG tablet Take 15 mg by mouth daily. 08/31/20   [provider]   DG Forearm Right  Result Date: 01/03/2021 CLINICAL DATA:  Dog bite. Pain in the midshaft of the forearm. Injury 5 days ago. EXAM: RIGHT FOREARM - 2 VIEW COMPARISON:  None. FINDINGS: There is irregularity of the soft tissues of the mid forearm. No radiopaque foreign body or soft tissue gas. No acute fracture. IMPRESSION: No acute fracture.  Soft tissue irregularity. Electronically Signed   By: Norva Pavlov M.D.   On: 01/03/2021 14:23    Positive ROS: All other systems have been reviewed and were otherwise negative with the exception of those mentioned in the HPI and as above.  Physical Exam: Vitals: Refer to EMR. Constitutional:  WD, WN, NAD HEENT:  NCAT, EOMI Neuro/Psych:  Alert & oriented to person, place, and time; appropriate mood &  affect Lymphatic: No generalized extremity edema or lymphadenopathy Extremities / MSK:  The extremities are normal with respect to appearance, ranges of motion, joint stability, muscle strength/tone, sensation, & perfusion except as otherwise noted:  Right forearm is mild to moderately swollen.  There are scattered punctures on the volar and ulnar surface.  They are draining into the dressing a scant amount of seropurulent drainage and there is a detectable odor.  Intact light touch sensibility in the radial, median, and ulnar nerve distributions with intact motor to the same.  He can extend the wrist and digits to neutral, and even hyperextension but with some increased soreness doing such maneuver to the long and ring fingers.  WBC 6.2  Assessment: 14-day-old right forearm infected dog bite, presently with draining openings in the skin, but possibly with deep infection that is not adequately draining  Recommendations: Agree with initial treatment with parenteral antibiotics, especially since the skin wounds are already open.  It may be that there is some loculated fluid collection or myonecrosis that is not presently able to drain.  We can see how he responds clinically over the next 24 to 48 hours.  He would likely be helped symptomatically and functionally by adding an anti-inflammatory to his drug regimen.  In case the infection fails to improve, and it is determined that he would benefit from undergoing treatment in the operating room, I will stop heparin, begin SCDs for VTE prophylaxis, and make him n.p.o. after midnight tonight for reassessment early tomorrow before eating.  Cliffton Asters Janee Morn, MD      Orthopaedic & Hand Surgery [facility] [address] [place], [place]  [address] Office: [phone] Mobile: [phone]  01/03/2021, 6:53 PM

## 2021-01-03 NOTE — H&P (Signed)
History and Physical    REED DADY FUX:[id number] DOB: April 26, 1966 DOA: 01/03/2021  PCP: Daisy Floro, MD   Patient coming from: home   I have personally briefly reviewed patient's old medical records in [facility]  Chief Complaint: right forearm swelling, pain and redness. Recent dog bite.  HPI: Adam Bowman is a 55 y.o. male with medical history significant of diabetic nephropathy, HTN, HLD, CKD stage 3b, seizure disorder, GERD and depression; who presented with 5 days hx of dog bite into his right forearm and subsequent development of redness, pain, swelling and purulent discharge from the affected area. Patient denies CP, SOB, nausea, vomiting, dysuria, hematuria, fever, focl weakness or any other complaints.  ED Course: IV antibiotics initiated, analgesics and IVF's given. X-ray without visible abnormalities. Hand surgery consulted and recommendations for conservative management with IV antibiotics for the next 24-48 hours recommended.  Review of Systems: As per HPI otherwise all other systems reviewed and are negative.  No surgical medical history   Past medical history as mentioned above.  Social History  reports that he has never smoked. He has never used smokeless tobacco. No history on file for alcohol use and drug use.  Allergies  Allergen Reactions  . Codeine Swelling    Face numbness   . Fenofibrate Swelling    Legs turn red and swell  . Ibuprofen     Stage 4 Kidney failure. Cannot take.  Marland Kitchen Procardia [Nifedipine] Swelling  . Sulfa Antibiotics Swelling    Family history -positive for HTN and diabetes as per patient recolection.   Prior to Admission medications   Medication Sig Start Date End Date Taking? Authorizing Provider  Ascorbic Acid (VITAMIN C) 500 MG CHEW Chew 4,000 mg by mouth in the morning and at bedtime. Take 8 tablets BID   Yes [provider]  aspirin EC 81 MG tablet Take 81 mg daily by mouth.   Yes [provider]   atorvastatin (LIPITOR) 20 MG tablet Take 20 mg daily by mouth.   Yes [provider]  b complex vitamins tablet Take 1 tablet by mouth at bedtime.   Yes [provider]  BEE POLLEN PO Take 1 capsule by mouth in the morning and at bedtime.   Yes [provider]  Cholecalciferol (VITAMIN D3) 2000 units TABS Take 4,000 Units by mouth at bedtime.   Yes [provider]  CINNAMON PO Take 2 capsules by mouth in the morning and at bedtime.   Yes [provider]  Coenzyme Q10 (COQ-10 PO) Take 1 capsule by mouth daily.   Yes [provider]  Cyanocobalamin (BL VITAMIN B-12 PO) Take 1 tablet by mouth daily.   Yes [provider]  divalproex (DEPAKOTE ER) 500 MG 24 hr tablet Take 4 tablets (2,000 mg total) by mouth daily. Patient taking differently: Take 500-1,500 mg by mouth in the morning and at bedtime. Take 1 tablet (500 mg) in the morning & Take 3 tablets (1500 mg) at bedtime 05/16/20  Yes Cottle, Steva Ready., MD  Fexofenadine HCl (ALLERGY 24-HR PO) Take 1 tablet by mouth daily.   Yes [provider]  fluticasone (FLONASE) 50 MCG/ACT nasal spray Place 1 spray into both nostrils daily.   Yes [provider]  furosemide (LASIX) 20 MG tablet Take 20 mg by mouth daily. 05/04/20  Yes [provider]  Chilton Si Tea 250 MG CAPS Take 1 capsule by mouth 2 (two) times daily.   Yes [provider]  GuaiFENesin (MUCINEX PO) Take 1 tablet by mouth daily.   Yes [provider]  JARDIANCE 25 MG TABS tablet Take 25 mg by mouth daily. 05/11/19  Yes [provider]  KRILL OIL PO Take 1 capsule by mouth 2 (two) times daily.   Yes [provider]  losartan (COZAAR) 50 MG tablet Take 50 mg daily by mouth.   Yes [provider]  metFORMIN (GLUCOPHAGE-XR) 500 MG 24 hr tablet Take 1,000 mg by mouth 2 (two) times daily. 12/14/20  Yes [provider]  Misc Natural Products (JOINT SUPPORT COMPLEX  PO) Take 1 capsule by mouth in the morning.   Yes [provider]  Misc Natural Products (PROSTATE) CAPS Take 1 capsule by mouth in the morning.   Yes [provider]  Multiple Vitamin (MULTIVITAMIN WITH MINERALS) TABS tablet Take 1 tablet daily by mouth.   Yes [provider]  multivitamin-lutein (OCUVITE-LUTEIN) CAPS capsule Take 1 capsule by mouth every evening.   Yes [provider]  Omega-3 Fatty Acids (FISH OIL) 1000 MG CAPS Take 3 capsules 2 (two) times daily by mouth.   Yes [provider]  pantoprazole (PROTONIX) 40 MG tablet Take 40 mg daily by mouth.   Yes [provider]  TESTOSTERONE UNDECANOATE PO Take 2 capsules by mouth daily.   Yes [provider]  Turmeric 500 MG CAPS Take 1,000 mg by mouth in the morning.   Yes [provider]  pioglitazone (ACTOS) 15 MG tablet Take 15 mg by mouth daily. 08/31/20   [provider]    Physical Exam: Vitals:   01/03/21 1330 01/03/21 1400 01/03/21 1440 01/03/21 1500  BP: 130/88 133/90 132/80 106/86  Pulse: 93 98 88 87  Resp: 18 17 16 16   Temp:      TempSrc:      SpO2: 99% 98% 99% 100%    Constitutional: NAD, calm, comfortable; complaining of pain in right forearm. Vitals:   01/03/21 1330 01/03/21 1400 01/03/21 1440 01/03/21 1500  BP: 130/88 133/90 132/80 106/86  Pulse: 93 98 88 87  Resp: 18 17 16 16   Temp:      TempSrc:      SpO2: 99% 98% 99% 100%   Eyes: PERRL, lids and conjunctivae normal, no icterus, no nystagmus. ENMT: Mucous membranes are moist. Posterior pharynx clear of any exudate or lesions.Normal dentition.  Neck: normal, supple, no masses, no thyromegaly, no JVD Respiratory: clear to auscultation bilaterally, no wheezing, no crackles. Normal respiratory effort. No accessory muscle use.  Cardiovascular: Regular rate and rhythm, no murmurs / rubs / gallops. No extremity edema. 2+ pedal pulses. No carotid bruits.  Abdomen: no tenderness, no  masses palpated. No hepatosplenomegaly. Bowel sounds positive.  Musculoskeletal: no clubbing / cyanosis. No joint deformity upper and lower extremities. Skin: no petechiae, positive erythema and open draining wounds in his forearm. Positive sweling appreciated. Neurologic: CN 2-12 grossly intact. Sensation intact, DTR normal. Strength 5/5 in all 4.  Psychiatric: Normal judgment and insight. Alert and oriented x 3. Normal mood.     Labs on Admission: I have personally reviewed following labs and imaging studies  CBC: Recent Labs  Lab 01/03/21 1149  WBC 6.2  NEUTROABS 3.7  HGB 12.8*  HCT 38.4*  MCV 98.5  PLT 175    Basic Metabolic Panel: Recent Labs  Lab 01/03/21 1149  NA 138  K 4.0  CL 106  CO2 18*  GLUCOSE 200*  BUN 40*  CREATININE 2.35*  CALCIUM 9.6    GFR: CrCl cannot be calculated (Unknown ideal weight.).  Liver Function Tests: Recent Labs  Lab 01/03/21 1149  AST 30  ALT 33  ALKPHOS 64  BILITOT 0.7  PROT 7.6  ALBUMIN 3.5    Urine analysis: No results found for: COLORURINE, APPEARANCEUR, LABSPEC, PHURINE, GLUCOSEU, HGBUR, BILIRUBINUR, KETONESUR, PROTEINUR, UROBILINOGEN, NITRITE, LEUKOCYTESUR  Radiological Exams on Admission: DG Forearm Right  Result Date: 01/03/2021 CLINICAL DATA:  Dog bite. Pain in the midshaft of the forearm. Injury 5 days ago. EXAM: RIGHT FOREARM - 2 VIEW COMPARISON:  None. FINDINGS: There is irregularity of the soft tissues of the mid forearm. No radiopaque foreign body or soft tissue gas. No acute fracture. IMPRESSION: No acute fracture.  Soft tissue irregularity. Electronically Signed   By: Norva Pavlov M.D.   On: 01/03/2021 14:23    EKG: None   Assessment/Plan 1-right forearm Dog bite: with cellulitis and concerns for underlying abscess development -continue IV antibiotics -follow hand surgery rec's -patient with recent tetanus vaccination -patient's dog was fully vaccinated. -gentle hydration, limb elevation and  toradol X 2 will be provided.  2-HTN (hypertension) -holding diuretics overnight -continue cozaar  -heart healthy diet ordered  3-type 2 diabetes with nephropathy -continue jardiance -started on levemir and SSI -follow CBG's -holding rest of oral hypoglycemic agents  4-HLD (hyperlipidemia) -continue statins   5-Allergic rhinitis -stable overall -resume home decongestants  6-hx of seizure  -continue depakote  7-CKD stage 3b -appears to be stable and currently at baseline -follow renal function trend  -minimize nephrotoxic agents     DVT prophylaxis: heparin Code Status:   full code Family Communication:  No family at bedside Disposition Plan:   Patient is from:  home  Anticipated DC to:  home  Anticipated DC date:  To be determined.  Anticipated DC barriers: Right forearm cellulitis/abscess treatment/stability  Consults called:  Dr. Janee Morn (hand surgery) Admission status:  Inpatient, med-surg bed; LOS > 2 midnight  Severity of Illness: Moderate severity and at risk for decompensation without IV antibiotics and possible need for surgical debridement.    Vassie Loll MD [facility]  How to contact the [facility] Attending or Consulting provider 7A - 7P or covering provider during after hours 7P -7A, for this patient?   1. Check the care team in [facility] and look for a) attending/consulting [facility] provider listed and b) the [facility] team listed 2. Log into [email or web address] and use [facility] universal password to access. If you do not have the password, please contact the hospital operator. 3. Locate the [facility] provider you are looking for under [facility] and page to a number that you can be directly reached. 4. If you still have difficulty reaching the provider, please page the [facility] (Director on Call) for the Hospitalists listed on amion for assistance.  01/03/2021, 3:22 PM

## 2021-01-03 NOTE — ED Triage Notes (Signed)
Pt send from [place] regarding dog bit from Friday on RUE that has become draining and odorous.

## 2021-01-03 NOTE — ED Notes (Signed)
X RAY at bedside 

## 2021-01-03 NOTE — Progress Notes (Signed)
History, chart, and photos reviewed.  WBC 6.2  Recommendations for EDP: 1. Spread open suspect bite wounds with local anesthetic to ensure ability to drain 2. Irrigate the open wounds 3. Admit for IV antibiotics  Hospitalist service: Please re-consult hand surgery to assist you in caring for this patient if operative indications develop.  Neil Crouch, MD Hand Surgery

## 2021-01-03 NOTE — ED Provider Notes (Addendum)
[facility]-EMERGENCY DEPT Provider Note   CSN: [id number] Arrival date & time: 01/03/21  1118     History Chief Complaint  Patient presents with  . Wound Infection    Adam Bowman is a 55 y.o. male.  HPI Patient reports he was bitten by his dog on Friday, 5 days ago.  Reports that his dog is fully immunized.  He was doing a training while the dog was eating.  The dog bit him on the forearm.  Reports he was keeping it clean and using alcohol.  He was not immediately seen and placed on antibiotics.  Patient reports today it got a lot more swollen and painful and the drainage started to smell really bad.  He went to his PCP office and was referred to the emergency department.  Patient reports that he can move his wrist but hurts an awful lot to extend it.  Ports he is getting pain up to the level of the elbow.  Patient has not eaten today.  He reports he did have a diet beverage which he finished at about 11 AM.    No past medical history on file.  There are no problems to display for this patient.   No past surgical history on file.     No family history on file.  Social History   Tobacco Use  . Smoking status: Never Smoker  . Smokeless tobacco: Never Used    Home Medications Prior to Admission medications   Medication Sig Start Date End Date Taking? Authorizing Provider  Ascorbic Acid (VITAMIN C) 1000 MG tablet Take 3,000 mg 2 (two) times daily by mouth.    [provider]  aspirin EC 81 MG tablet Take 81 mg daily by mouth.    [provider]  atorvastatin (LIPITOR) 20 MG tablet Take 20 mg daily by mouth.    [provider]  b complex vitamins tablet Take 1 tablet daily by mouth.    [provider]  BEE POLLEN PO Take by mouth.    [provider]  Cholecalciferol (VITAMIN D3) 2000 units TABS Take by mouth.    [provider]  CINNAMON PO Take by mouth.    [provider]  Coenzyme Q10  (COQ-10 PO) Take by mouth.    [provider]  Cyanocobalamin (BL VITAMIN B-12 PO) Take by mouth.    [provider]  divalproex (DEPAKOTE ER) 500 MG 24 hr tablet Take 4 tablets (2,000 mg total) by mouth daily. 05/16/20   Cottle, Steva Ready., MD  Fexofenadine HCl (ALLERGY 24-HR PO) Take by mouth.    [provider]  fluticasone (FLONASE) 50 MCG/ACT nasal spray Place into both nostrils daily.    [provider]  furosemide (LASIX) 20 MG tablet Take 20 mg by mouth daily. 05/04/20   [provider]  Chilton Si Tea 250 MG CAPS Take 2 (two) times daily by mouth.    [provider]  GuaiFENesin (MUCINEX PO) Take by mouth daily.     [provider]  JARDIANCE 25 MG TABS tablet 25 mg. 05/11/19   [provider]  KRILL OIL PO Take 2 (two) times daily by mouth.    [provider]  losartan (COZAAR) 50 MG tablet Take 50 mg daily by mouth.    [provider]  metFORMIN (GLUCOPHAGE) 500 MG tablet Take 1,000 mg by mouth 2 (two) times daily with a meal. 500 in the AM and 1000 in the  evening.    [provider]  Misc Natural Products (JOINT SUPPORT COMPLEX PO) Take by mouth.    [provider]  Multiple Vitamin (MULTIVITAMIN WITH MINERALS) TABS tablet Take 1 tablet daily by mouth.    [provider]  multivitamin-lutein (OCUVITE-LUTEIN) CAPS capsule Take 1 capsule by mouth daily.    [provider]  Omega-3 Fatty Acids (FISH OIL) 1000 MG CAPS Take 3 capsules 2 (two) times daily by mouth.    [provider]  pantoprazole (PROTONIX) 40 MG tablet Take 40 mg daily by mouth.    [provider]  Specialty Vitamins Products (PROSTATE PO) Take by mouth.    [provider]  TESTOSTERONE UNDECANOATE PO Take 2 tablets by mouth daily.    [provider]    Allergies    Codeine, Fenofibrate, Ibuprofen, Procardia [nifedipine], and Sulfa antibiotics  Review of Systems    Review of Systems 10 systems reviewed and negative except as per HPI Physical Exam Updated Vital Signs BP 116/87   Pulse 93   Temp 98.6 F (37 C) (Oral)   Resp 16   SpO2 96%   Physical Exam Constitutional:      Appearance: Normal appearance.  HENT:     Head: Normocephalic.     Mouth/Throat:     Pharynx: Oropharynx is clear.  Eyes:     Extraocular Movements: Extraocular movements intact.  Cardiovascular:     Rate and Rhythm: Normal rate and regular rhythm.     Pulses: Normal pulses.     Heart sounds: Normal heart sounds.  Abdominal:     General: There is no distension.     Palpations: Abdomen is soft.     Tenderness: There is no abdominal tenderness.  Musculoskeletal:     Comments: Patient has a bite wound to the right forearm see attached images.  There is active grayish drainage from this.  It is necrotic, foul-smelling.  There is erythema above the level of the elbow up to the axilla.  Tenderness and some induration just at the level of the medial epicondyle.  Patient can open and close his hand spontaneously.  Pain with extension of the wrist.  Radial pulse 2+.  Hand is warm and dry.  Skin:    General: Skin is warm and dry.  Neurological:     General: No focal deficit present.     Mental Status: He is alert and oriented to person, place, and time.     Coordination: Coordination normal.  Psychiatric:        Mood and Affect: Mood normal.         ED Results / Procedures / Treatments   Labs (all labs ordered are listed, but only abnormal results are displayed) Labs Reviewed  COMPREHENSIVE METABOLIC PANEL - Abnormal; Notable for the following components:      Result Value   CO2 18 (*)    Glucose, Bld 200 (*)    BUN 40 (*)    Creatinine, Ser 2.35 (*)    GFR, Estimated 32 (*)    All other components within normal limits  CBC WITH DIFFERENTIAL/PLATELET - Abnormal; Notable for the following components:   RBC 3.90 (*)    Hemoglobin 12.8 (*)    HCT 38.4 (*)     All other components within normal limits  LACTIC ACID, PLASMA  LACTIC ACID, PLASMA    EKG None  Radiology No results found.  Procedures Procedures   Medications Ordered in ED Medications - No  data to display  ED Course  I have reviewed the triage vital signs and the nursing notes.  Pertinent labs & imaging results that were available during my care of the patient were reviewed by me and considered in my medical decision making (see chart for details).  Clinical Course as of 01/03/21 1511  Tue Jan 03, 2021  1426 Consult: Reviewed with Dale [place] for admission.  Requested medical team admission and MRI. [MP]    Clinical Course User Index [MP] Arby Barrette, MD   MDM Rules/Calculators/A&P                          Patient had a dog bite 5 days ago.  He is diabetic.  Wound has become very swollen and is draining a necrotic smelling discharge.  He now has streaking erythema up to the axilla.  Patient started empirically on Rocephin 2 g and clindamycin.  Will consult hand surgery. Final Clinical Impression(s) / ED Diagnoses Final diagnoses:  Wound infection  Dog bite, initial encounter   Addendum: Dale [place] was consulted as indicated at 60: 26.  We reviewed the case and the patient's clinical exam with recommendation at that time for MRI.  I was in agreement with this recommendation.  Later, I received a chat message that Dr. Janee Morn instructs EDP to superficially incise the already draining wounds.  I returned the message indicating that I did not feel this was a productive measure given the physical exam I have already performed.  I indicated that if Dr. Janee Morn or Dale [place] felt that superficially incising this wound at bedside would be beneficial to the patient, that they could proceed with that measure themselves.  I also indicated that Dr. Janee Morn could speak with me directly should he wish to discuss this.  I did not receive any direct contact from Dr.  Janee Morn.  To my exam, this was a foul smelling necrotic wound, already spontaneously draining, without any evident fluctuance to suggest there was a deeper focus amenable to bedside I&D.  With 23 years of clinical practice, my opinion was this was a high risk wound and locally numbing superficial tissues and doing superficial I&D would do nothing to improve the outcomes and only cause pain and possibly some potential for worsening cosmetic or medical outcome.  It was my opinion that Dr. Janee Morn needed to evaluate the wound for possible further debridement or MRI to evaluate for deep space forearm infection.  Rx / DC Orders ED Discharge Orders    None       Arby Barrette, MD 01/12/21 1040    Arby Barrette, MD 01/12/21 1055

## 2021-01-04 ENCOUNTER — Encounter (HOSPITAL_COMMUNITY): Payer: Self-pay | Admitting: Internal Medicine

## 2021-01-04 DIAGNOSIS — N183 Chronic kidney disease, stage 3 unspecified: Secondary | ICD-10-CM | POA: Diagnosis present

## 2021-01-04 DIAGNOSIS — J309 Allergic rhinitis, unspecified: Secondary | ICD-10-CM | POA: Diagnosis not present

## 2021-01-04 DIAGNOSIS — N1832 Chronic kidney disease, stage 3b: Secondary | ICD-10-CM | POA: Diagnosis not present

## 2021-01-04 DIAGNOSIS — W540XXD Bitten by dog, subsequent encounter: Secondary | ICD-10-CM | POA: Diagnosis not present

## 2021-01-04 DIAGNOSIS — E785 Hyperlipidemia, unspecified: Secondary | ICD-10-CM | POA: Diagnosis not present

## 2021-01-04 LAB — BASIC METABOLIC PANEL
Anion gap: 14 (ref 5–15)
BUN: 43 mg/dL — ABNORMAL HIGH (ref 6–20)
CO2: 21 mmol/L — ABNORMAL LOW (ref 22–32)
Calcium: 8.8 mg/dL — ABNORMAL LOW (ref 8.9–10.3)
Chloride: 103 mmol/L (ref 98–111)
Creatinine, Ser: 1.97 mg/dL — ABNORMAL HIGH (ref 0.61–1.24)
GFR, Estimated: 39 mL/min — ABNORMAL LOW (ref >60)
Glucose, Bld: 183 mg/dL — ABNORMAL HIGH (ref 70–99)
Potassium: 3.8 mmol/L (ref 3.5–5.1)
Sodium: 138 mmol/L (ref 135–145)

## 2021-01-04 LAB — CBC WITH DIFFERENTIAL/PLATELET
Abs Immature Granulocytes: 0.02 10*3/uL (ref 0.00–0.07)
Basophils Absolute: 0 10*3/uL (ref 0.0–0.1)
Basophils Relative: 0 %
Eosinophils Absolute: 0.1 10*3/uL (ref 0.0–0.5)
Eosinophils Relative: 1 %
HCT: 32.7 % — ABNORMAL LOW (ref 39.0–52.0)
Hemoglobin: 10.9 g/dL — ABNORMAL LOW (ref 13.0–17.0)
Immature Granulocytes: 0 %
Lymphocytes Relative: 36 %
Lymphs Abs: 1.9 10*3/uL (ref 0.7–4.0)
MCH: 32.3 pg (ref 26.0–34.0)
MCHC: 33.3 g/dL (ref 30.0–36.0)
MCV: 97 fL (ref 80.0–100.0)
Monocytes Absolute: 0.4 10*3/uL (ref 0.1–1.0)
Monocytes Relative: 8 %
Neutro Abs: 2.9 10*3/uL (ref 1.7–7.7)
Neutrophils Relative %: 55 %
Platelets: 157 10*3/uL (ref 150–400)
RBC: 3.37 MIL/uL — ABNORMAL LOW (ref 4.22–5.81)
RDW: 12.9 % (ref 11.5–15.5)
WBC: 5.2 10*3/uL (ref 4.0–10.5)
nRBC: 0 % (ref 0.0–0.2)

## 2021-01-04 LAB — MRSA PCR SCREENING: MRSA by PCR: POSITIVE — AB

## 2021-01-04 LAB — GLUCOSE, CAPILLARY
Glucose-Capillary: 144 mg/dL — ABNORMAL HIGH (ref 70–99)
Glucose-Capillary: 210 mg/dL — ABNORMAL HIGH (ref 70–99)
Glucose-Capillary: 126 mg/dL — ABNORMAL HIGH (ref 70–99)
Glucose-Capillary: 116 mg/dL — ABNORMAL HIGH (ref 70–99)

## 2021-01-04 LAB — MAGNESIUM: Magnesium: 2.2 mg/dL (ref 1.7–2.4)

## 2021-01-04 MED ORDER — CHLORHEXIDINE GLUCONATE CLOTH 2 % EX PADS
6.0000 | MEDICATED_PAD | Freq: Every day | CUTANEOUS | Status: DC
  Administered 2021-01-04 – 2021-01-07 (×4): 6 via TOPICAL

## 2021-01-04 MED ORDER — HEPARIN SODIUM (PORCINE) 5000 UNIT/ML IJ SOLN: 5000.0000 [IU] | Freq: Three times a day (TID) | INTRAMUSCULAR | Status: DC

## 2021-01-04 MED ORDER — MUPIROCIN 2 % EX OINT
1.0000 "application " | TOPICAL_OINTMENT | Freq: Two times a day (BID) | CUTANEOUS | Status: DC
  Administered 2021-01-04 – 2021-01-07 (×7): 1 via NASAL
  Filled 2021-01-04: qty 22

## 2021-01-04 MED ORDER — WITCH HAZEL-GLYCERIN EX PADS
MEDICATED_PAD | CUTANEOUS | Status: DC | PRN
  Filled 2021-01-04: qty 100

## 2021-01-04 NOTE — Progress Notes (Signed)
PROGRESS NOTE    Adam Bowman  YKZ:[id number] DOB: 1966-01-16 DOA: 01/03/2021 PCP: Daisy Floro, MD   Chief Complaint  Patient presents with  . Wound Infection    Brief Narrative: Patient is a 55 year old gentleman history of diabetic nephropathy, hypertension, hyperlipidemia, chronic kidney disease stage IIIb, seizure disorder, GERD, depression presented with a 5-day history of dog bite to the right forearm with subsequent development of redness, pain, swelling and purulent discharge from affected area.  Patient placed on IV antibiotics.  Hand surgery consulted and recommended conservative management with IV antibiotics.   Assessment & Plan:   Principal Problem:   Dog bite Active Problems:   HTN (hypertension)   Type 2 diabetes with nephropathy (HCC)   HLD (hyperlipidemia)   Allergic rhinitis   CKD (chronic kidney disease) stage 3, GFR 30-59 ml/min (HCC)  1 right forearm dog bite/cellulitis -Patient presented with worsening right forearm cellulitis/erythema secondary to dog bite. -Plain films of the right forearm with no acute fracture, soft tissue irregularity. -Afebrile.  Normal white count. -Patient seen in consultation by hand surgery, Dr. Mack Hook as recommended conservative treatment with IV antibiotics at this time and no need for acute surgical intervention. -Per admitting physician patient with recent tetanus vaccination. -Patient's dog noted to be fully vaccinated. -Continue IV Unasyn, limb elevation, gentle hydration, wound care orders as written by hand surgeon. -Tylenol as needed.  2.  Hypertension -Blood pressure currently controlled. -Continue to hold diuretics. -Continue Cozaar.  3.  Diabetes mellitus type 2 -Hemoglobin A1c 8.3.  CBG 144 this morning -Continue current regimen of daily.  SSI.  4.  Hyperlipidemia -Continue statin, Lovaza.  5.  Allergic rhinitis -Stable. -Continue Allegra, Flonase.  6.  History of seizures -Currently  seizure-free. -Continue home regimen Depakote.  7.  Chronic kidney disease stage IIIb -Stable. -Avoid nephrotoxins.  8.  Abnormal HIV antibody test -Reflex confirmatory test pending.   DVT prophylaxis: SCD Code Status: Full Family Communication: Updated patient and wife at bedside Disposition:   Status is: Inpatient    Dispo: The patient is from: Home              Anticipated d/c is to: Home              Patient currently on IV antibiotics, not stable for discharge   Difficult to place patient no       Consultants:   Hand surgery: Dr. Mack Hook 01/03/2021  Procedures:   Plain films of the right forearm 01/03/2021  Antimicrobials:   IV Unasyn 01/03/2021>>>.  IV Rocephin 5/10 /2022x1 dose  IV clindamycin 5/10/ 2022x1 dose   Subjective: Patient laying in bed states some improvement with right hand pain and swelling.  Denies any chest pain.  No shortness of breath.  No abdominal pain  Objective: Vitals:   01/03/21 2137 01/04/21 0132 01/04/21 0546 01/04/21 1300  BP: 129/77 110/66 125/73 110/75  Pulse: 79 76 74 78  Resp: 18 (!) 21 15 18   Temp: 98.2 F (36.8 C) 97.9 F (36.6 C) 97.6 F (36.4 C) 98.2 F (36.8 C)  TempSrc: Oral Oral Oral Oral  SpO2: 100% 97% 98% 98%    Intake/Output Summary (Last 24 hours) at 01/04/2021 1737 Last data filed at 01/04/2021 1357 Gross per 24 hour  Intake 1620 ml  Output 0 ml  Net 1620 ml   There were no vitals filed for this visit.  Examination:  General exam: Appears calm and comfortable  Respiratory system: Clear to auscultation.  Respiratory effort normal. Cardiovascular system: S1 & S2 heard, RRR. No JVD, murmurs, rubs, gallops or clicks. No pedal edema. Gastrointestinal system: Abdomen is nondistended, soft and nontender. No organomegaly or masses felt. Normal bowel sounds heard. Central nervous system: Alert and oriented. No focal neurological deficits. Extremities: Right upper extremity bandaged, less swollen.   Skin: No rashes, lesions or ulcers Psychiatry: Judgement and insight appear normal. Mood & affect appropriate.     Data Reviewed: I have personally reviewed following labs and imaging studies  CBC: Recent Labs  Lab 01/03/21 1149 01/04/21 1213  WBC 6.2 5.2  NEUTROABS 3.7 2.9  HGB 12.8* 10.9*  HCT 38.4* 32.7*  MCV 98.5 97.0  PLT 175 157    Basic Metabolic Panel: Recent Labs  Lab 01/03/21 1149 01/03/21 1403 01/04/21 1213  NA 138  --  138  K 4.0  --  3.8  CL 106  --  103  CO2 18*  --  21*  GLUCOSE 200*  --  183*  BUN 40*  --  43*  CREATININE 2.35*  --  1.97*  CALCIUM 9.6  --  8.8*  MG  --  2.2 2.2  PHOS  --  3.6  --     GFR: CrCl cannot be calculated (Unknown ideal weight.).  Liver Function Tests: Recent Labs  Lab 01/03/21 1149  AST 30  ALT 33  ALKPHOS 64  BILITOT 0.7  PROT 7.6  ALBUMIN 3.5    CBG: Recent Labs  Lab 01/03/21 1731 01/03/21 2138 01/04/21 0746 01/04/21 1141 01/04/21 1622  GLUCAP 144* 177* 144* 210* 126*     Recent Results (from the past 240 hour(s))  Culture, blood (routine x 2)     Status: None (Preliminary result)   Collection Time: 01/03/21  1:25 PM   Specimen: BLOOD  Result Value Ref Range Status   Specimen Description   Final    BLOOD RIGHT ANTECUBITAL Performed at [facility], [address], [place], [place] [address]    Special Requests   Final    BOTTLES DRAWN AEROBIC AND ANAEROBIC Blood Culture adequate volume Performed at [facility], [address], [place], [place] [address]    Culture   Final    NO GROWTH < 12 HOURS Performed at [facility], [address] [place], [place] [address]    Report Status PENDING  Incomplete  Culture, blood (routine x 2)     Status: None (Preliminary result)   Collection Time: 01/03/21  2:02 PM   Specimen: BLOOD  Result Value Ref Range Status   Specimen Description   Final    BLOOD LEFT ANTECUBITAL Performed at [facility], [address] [place], [place] [address]    Special Requests   Final    BOTTLES DRAWN AEROBIC AND ANAEROBIC Blood Culture adequate volume Performed at [facility], [address], [place], [place] [address]    Culture   Final    NO GROWTH < 12 HOURS Performed at [facility], [address], [place], [place] [address]    Report Status PENDING  Incomplete  Resp Panel by RT-PCR (Flu A&B, Covid) Nasopharyngeal Swab     Status: None   Collection Time: 01/03/21  2:03 PM   Specimen: Nasopharyngeal Swab; Nasopharyngeal(NP) swabs in vial transport medium  Result Value Ref Range Status   SARS Coronavirus 2 by RT PCR NEGATIVE NEGATIVE Final    Comment: (NOTE) SARS-CoV-2 target nucleic acids  are NOT DETECTED.  The SARS-CoV-2 RNA is generally detectable in upper respiratory specimens during the acute phase of infection. The lowest concentration of SARS-CoV-2 viral copies this assay can detect is 138 copies/mL. A negative result does not preclude SARS-Cov-2 infection and should not be used as the sole basis for treatment or other patient management decisions. A negative result may occur with  improper specimen collection/handling, submission of specimen other than nasopharyngeal swab, presence of viral mutation(s) within the areas targeted by this assay, and inadequate number of viral copies(<138 copies/mL). A negative result must be combined with clinical observations, patient history, and epidemiological information. The expected result is Negative.  Fact Sheet for Patients:  BloggerCourse.comhttps://www.fda.gov/media/152166/download  Fact Sheet for Healthcare Providers:  SeriousBroker.ithttps://www.fda.gov/media/152162/download  This test is no t yet approved or cleared by the [place] FDA and  has been authorized for detection and/or diagnosis of SARS-CoV-2 by FDA under an Emergency Use Authorization (EUA). This EUA will remain  in effect (meaning this test can be  used) for the duration of the COVID-19 declaration under Section 564(b)(1) of the Act, 21 U.S.C.section 360bbb-3(b)(1), unless the authorization is terminated  or revoked sooner.       Influenza A by PCR NEGATIVE NEGATIVE Final   Influenza B by PCR NEGATIVE NEGATIVE Final    Comment: (NOTE) The Xpert Xpress SARS-CoV-2/FLU/RSV plus assay is intended as an aid in the diagnosis of influenza from Nasopharyngeal swab specimens and should not be used as a sole basis for treatment. Nasal washings and aspirates are unacceptable for Xpert Xpress SARS-CoV-2/FLU/RSV testing.  Fact Sheet for Patients: BloggerCourse.comhttps://www.fda.gov/media/152166/download  Fact Sheet for Healthcare Providers: SeriousBroker.ithttps://www.fda.gov/media/152162/download  This test is not yet approved or cleared by the [place] FDA and has been authorized for detection and/or diagnosis of SARS-CoV-2 by FDA under an Emergency Use Authorization (EUA). This EUA will remain in effect (meaning this test can be used) for the duration of the COVID-19 declaration under Section 564(b)(1) of the Act, 21 U.S.C. section 360bbb-3(b)(1), unless the authorization is terminated or revoked.  Performed at [facility], [address] [place], [place] [address]   MRSA PCR Screening     Status: Abnormal   Collection Time: 01/04/21  2:49 AM   Specimen: Nasal Mucosa; Nasopharyngeal  Result Value Ref Range Status   MRSA by PCR POSITIVE (A) NEGATIVE Final    Comment:        The GeneXpert MRSA Assay (FDA approved for NASAL specimens only), is one component of a comprehensive MRSA colonization surveillance program. It is not intended to diagnose MRSA infection nor to guide or monitor treatment for MRSA infections. RESULT CALLED TO, READ BACK BY AND VERIFIED WITH: Ricki RodriguezDRIANA, RN @ [phone] ON 01/04/21 Riesa Pope VARNER Performed at [facility], [address] [place], [place] [address]          Radiology Studies: DG  Forearm Right  Result Date: 01/03/2021 CLINICAL DATA:  Dog bite. Pain in the midshaft of the forearm. Injury 5 days ago. EXAM: RIGHT FOREARM - 2 VIEW COMPARISON:  None. FINDINGS: There is irregularity of the soft tissues of the mid forearm. No radiopaque foreign body or soft tissue gas. No acute fracture. IMPRESSION: No acute fracture.  Soft tissue irregularity. Electronically Signed   By: Norva PavlovElizabeth  Brown M.D.   On: 01/03/2021 14:23        Scheduled Meds: . aspirin EC  81 mg Oral Daily  . atorvastatin  20 mg Oral Daily  . Chlorhexidine Gluconate  Cloth  6 each Topical [id number]  . divalproex  1,500 mg Oral QHS  . divalproex  500 mg Oral Daily  . fexofenadine  180 mg Oral Daily  . fluticasone  1 spray Each Nare Daily  . insulin aspart  0-15 Units Subcutaneous TID WC  . insulin aspart  0-5 Units Subcutaneous QHS  . insulin detemir  10 Units Subcutaneous QHS  . losartan  50 mg Oral Daily  . multivitamin  1 tablet Oral QPM  . mupirocin ointment  1 application Nasal BID  . omega-3 acid ethyl esters  2 g Oral BID  . pantoprazole  40 mg Oral Daily   Continuous Infusions: . ampicillin-sulbactam (UNASYN) IV 3 g (01/04/21 1406)     LOS: 1 day    Time spent: 40 minutes    Ramiro Harvest, MD [facility]   To contact the attending provider between 7A-7P or the covering provider during after hours 7P-7A, please log into the web site [email or web address] and access using universal [place] password for that web site. If you do not have the password, please call the hospital operator.  01/04/2021, 5:37 PM

## 2021-01-04 NOTE — Progress Notes (Addendum)
  PATIENT ID: NAITHAN DELAGE  MRN: [id number]  DOB/AGE:  57-13-1967 / 57 y.o.      Subjective: Reports getting some sleep overnight.  Pain is less.   Objective: Has remained afebrile. Forearm is less swollen/tight.  Active digital ROM is full, wrist improved, and composite motion improved, especially combined extension  Recommendations: Improving with parenteral antibiotic therapy No current indication for surgical augmentation to care plan Will write order for wound care for the bite wounds themselves and sign off. Would continue to avoid pharmacologic VTE prophylaxis Please re-consult if perceived need for surgical intervention arises.  Cliffton Asters Janee Morn, MD      Orthopaedic & Hand Surgery [facility] [address] [place], [place]  [address] Office: [phone] Mobile: [phone]  01/04/2021, 7:22 AM

## 2021-01-05 DIAGNOSIS — W540XXD Bitten by dog, subsequent encounter: Secondary | ICD-10-CM | POA: Diagnosis not present

## 2021-01-05 DIAGNOSIS — J309 Allergic rhinitis, unspecified: Secondary | ICD-10-CM | POA: Diagnosis not present

## 2021-01-05 DIAGNOSIS — N1832 Chronic kidney disease, stage 3b: Secondary | ICD-10-CM | POA: Diagnosis not present

## 2021-01-05 DIAGNOSIS — E785 Hyperlipidemia, unspecified: Secondary | ICD-10-CM | POA: Diagnosis not present

## 2021-01-05 LAB — CBC WITH DIFFERENTIAL/PLATELET
Abs Immature Granulocytes: 0.03 10*3/uL (ref 0.00–0.07)
Basophils Absolute: 0 10*3/uL (ref 0.0–0.1)
Basophils Relative: 0 %
Eosinophils Absolute: 0.1 10*3/uL (ref 0.0–0.5)
Eosinophils Relative: 2 %
HCT: 33.8 % — ABNORMAL LOW (ref 39.0–52.0)
Hemoglobin: 11.1 g/dL — ABNORMAL LOW (ref 13.0–17.0)
Immature Granulocytes: 1 %
Lymphocytes Relative: 29 %
Lymphs Abs: 1.9 10*3/uL (ref 0.7–4.0)
MCH: 32.9 pg (ref 26.0–34.0)
MCHC: 32.8 g/dL (ref 30.0–36.0)
MCV: 100.3 fL — ABNORMAL HIGH (ref 80.0–100.0)
Monocytes Absolute: 0.6 10*3/uL (ref 0.1–1.0)
Monocytes Relative: 9 %
Neutro Abs: 3.9 10*3/uL (ref 1.7–7.7)
Neutrophils Relative %: 59 %
Platelets: 162 10*3/uL (ref 150–400)
RBC: 3.37 MIL/uL — ABNORMAL LOW (ref 4.22–5.81)
RDW: 12.9 % (ref 11.5–15.5)
WBC: 6.5 10*3/uL (ref 4.0–10.5)
nRBC: 0 % (ref 0.0–0.2)

## 2021-01-05 LAB — GLUCOSE, CAPILLARY
Glucose-Capillary: 142 mg/dL — ABNORMAL HIGH (ref 70–99)
Glucose-Capillary: 224 mg/dL — ABNORMAL HIGH (ref 70–99)
Glucose-Capillary: 221 mg/dL — ABNORMAL HIGH (ref 70–99)
Glucose-Capillary: 158 mg/dL — ABNORMAL HIGH (ref 70–99)

## 2021-01-05 LAB — BASIC METABOLIC PANEL
Anion gap: 10 (ref 5–15)
BUN: 48 mg/dL — ABNORMAL HIGH (ref 6–20)
CO2: 21 mmol/L — ABNORMAL LOW (ref 22–32)
Calcium: 8.8 mg/dL — ABNORMAL LOW (ref 8.9–10.3)
Chloride: 107 mmol/L (ref 98–111)
Creatinine, Ser: 2.68 mg/dL — ABNORMAL HIGH (ref 0.61–1.24)
GFR, Estimated: 27 mL/min — ABNORMAL LOW (ref >60)
Glucose, Bld: 145 mg/dL — ABNORMAL HIGH (ref 70–99)
Potassium: 4.2 mmol/L (ref 3.5–5.1)
Sodium: 138 mmol/L (ref 135–145)

## 2021-01-05 MED ORDER — SODIUM CHLORIDE 0.9 % IV SOLN
3.0000 g | Freq: Three times a day (TID) | INTRAVENOUS | Status: DC
  Administered 2021-01-05 – 2021-01-07 (×6): 3 g via INTRAVENOUS
  Filled 2021-01-05: qty 3
  Filled 2021-01-05 (×2): qty 8
  Filled 2021-01-05 (×4): qty 3

## 2021-01-05 MED ORDER — SODIUM CHLORIDE 0.9 % IV SOLN: INTRAVENOUS | Status: DC

## 2021-01-05 NOTE — Progress Notes (Signed)
PROGRESS NOTE    Adam Bowman  UEA:[id number] DOB: Mar 18, 1966 DOA: 01/03/2021 PCP: Daisy Florooss, Charles Alan, MD   Chief Complaint  Patient presents with  . Wound Infection    Brief Narrative: Patient is a 55 year old gentleman history of diabetic nephropathy, hypertension, hyperlipidemia, chronic kidney disease stage IIIb, seizure disorder, GERD, depression presented with a 5-day history of dog bite to the right forearm with subsequent development of redness, pain, swelling and purulent discharge from affected area.  Patient placed on IV antibiotics.  Hand surgery consulted and recommended conservative management with IV antibiotics.   Assessment & Plan:   Principal Problem:   Dog bite Active Problems:   HTN (hypertension)   Type 2 diabetes with nephropathy (HCC)   HLD (hyperlipidemia)   Allergic rhinitis   CKD (chronic kidney disease) stage 3, GFR 30-59 ml/min (HCC)  1 right forearm dog bite/cellulitis -Patient presented with worsening right forearm cellulitis/erythema secondary to dog bite. -Plain films of the right forearm with no acute fracture, soft tissue irregularity. -Afebrile.  Normal white count. -Pungent malodorous discharge noted on right forearm wound today. -Patient seen in consultation by hand surgery, Dr. Mack Hookavid Ramaya Guile as recommended conservative treatment with IV antibiotics at this time and no need for acute surgical intervention. -Per admitting physician patient with recent tetanus vaccination. -Patient's dog noted to be fully vaccinated. -Due to pungent odor or and discharge we will culture wounds. -Continue IV Unasyn, gentle hydration, limb elevation, wound care orders as written by hand surgery.   -Tylenol as needed.   -If continued improvement could likely transition to oral antibiotics of Augmentin in the next 1 to 2 days.  -We will need outpatient follow-up with hand surgery.  2.  Hypertension -Controlled on current regimen of Cozaar.   -Continue to  hold diuretics.   -Outpatient follow-up.    3.  Diabetes mellitus type 2 -Hemoglobin A1c 8.3.  CBG 142 this morning -Continue Levemir, SSI.   4.  Hyperlipidemia -Continue Lovaza, statin.   5.  Allergic rhinitis -Stable. -Continue Allegra, Flonase.  6.  History of seizures -Seizure-free.   -Continue home regimen Depakote.   7.  Chronic kidney disease stage IIIb -Patient with a slight bump in creatinine from baseline.   -Gentle hydration with IV fluids for the next 24 hours.   -Avoid nephrotoxins.  8.  Abnormal HIV antibody test -Reflex confirmatory test pending.   DVT prophylaxis: SCD Code Status: Full Family Communication: Updated patient and wife at bedside Disposition:   Status is: Inpatient    Dispo: The patient is from: Home              Anticipated d/c is to: Home              Patient currently on IV antibiotics, not stable for discharge   Difficult to place patient no       Consultants:   Hand surgery: Dr. Mack Hookavid Cheyeanne Roadcap 01/03/2021  Procedures:   Plain films of the right forearm 01/03/2021  Antimicrobials:   IV Unasyn 01/03/2021>>>.  IV Rocephin 5/10 /2022x1 dose  IV clindamycin 5/10/ 2022x1 dose   Subjective: Patient complaining of significant odor or to right upper extremity cellulitis/wound area.  Patient feels swelling and erythema have improved since admission.  Patient denies any chest pain.  No shortness of breath.  No abdominal pain.  Tolerating current diet.  Patient states pain currently controlled  Objective: Vitals:   01/04/21 0546 01/04/21 1300 01/04/21 2150 01/05/21 0533  BP: 125/73 110/75 113/69 118/75  Pulse: 74 78 74 73  Resp: 15 18 17 17   Temp: 97.6 F (36.4 C) 98.2 F (36.8 C) 97.7 F (36.5 C) 98.6 F (37 C)  TempSrc: Oral Oral Oral Oral  SpO2: 98% 98% 100% 97%    Intake/Output Summary (Last 24 hours) at 01/05/2021 1101 Last data filed at 01/05/2021 1000 Gross per 24 hour  Intake 1380 ml  Output 0 ml  Net 1380 ml    There were no vitals filed for this visit.  Examination:  General exam: : NAD Respiratory system: CTA B anterior lung fields.  No wheezes, no rhonchi.  Speaking in full sentences.  Normal respiratory effort. Cardiovascular system: Regular rate and rhythm no murmurs rubs or gallops.  No JVD.  No lower extremity edema.  Gastrointestinal system: Abdomen soft, nontender, nondistended, positive bowel sounds.  No rebound.  No guarding. Central nervous system: Alert and oriented. No focal neurological deficits. Extremities: Right upper extremity with less swelling, less erythema, scattered areas of punctures on volar and ulnar surface with malodorous discharge, decreased tenderness to palpation. Skin: No rashes, lesions or ulcers Psychiatry: Judgement and insight appear normal. Mood & affect appropriate.  Data Reviewed: I have personally reviewed following labs and imaging studies  CBC: Recent Labs  Lab 01/03/21 1149 01/04/21 1213 01/05/21 0421  WBC 6.2 5.2 6.5  NEUTROABS 3.7 2.9 3.9  HGB 12.8* 10.9* 11.1*  HCT 38.4* 32.7* 33.8*  MCV 98.5 97.0 100.3*  PLT 175 157 162    Basic Metabolic Panel: Recent Labs  Lab 01/03/21 1149 01/03/21 1403 01/04/21 1213 01/05/21 0421  NA 138  --  138 138  K 4.0  --  3.8 4.2  CL 106  --  103 107  CO2 18*  --  21* 21*  GLUCOSE 200*  --  183* 145*  BUN 40*  --  43* 48*  CREATININE 2.35*  --  1.97* 2.68*  CALCIUM 9.6  --  8.8* 8.8*  MG  --  2.2 2.2  --   PHOS  --  3.6  --   --     GFR: CrCl cannot be calculated (Unknown ideal weight.).  Liver Function Tests: Recent Labs  Lab 01/03/21 1149  AST 30  ALT 33  ALKPHOS 64  BILITOT 0.7  PROT 7.6  ALBUMIN 3.5    CBG: Recent Labs  Lab 01/04/21 0746 01/04/21 1141 01/04/21 1622 01/04/21 2153 01/05/21 0734  GLUCAP 144* 210* 126* 116* 142*     Recent Results (from the past 240 hour(s))  Culture, blood (routine x 2)     Status: None (Preliminary result)   Collection Time: 01/03/21   1:25 PM   Specimen: BLOOD  Result Value Ref Range Status   Specimen Description   Final    BLOOD RIGHT ANTECUBITAL Performed at [facility], [address] [place], [place]    Special Requests   Final    BOTTLES DRAWN AEROBIC AND ANAEROBIC Blood Culture adequate volume Performed at [facility], [address], [place], [place]    Culture   Final    NO GROWTH 2 DAYS Performed at [facility], [address], [place], [place]    Report Status PENDING  Incomplete  Culture, blood (routine x 2)     Status: None (Preliminary result)   Collection Time: 01/03/21  2:02 PM   Specimen: BLOOD  Result Value Ref Range Status   Specimen Description   Final  BLOOD LEFT ANTECUBITAL Performed at [facility], [address] [place], [place] [address]    Special Requests   Final    BOTTLES DRAWN AEROBIC AND ANAEROBIC Blood Culture adequate volume Performed at [facility], [address], [place], [place] [address]    Culture   Final    NO GROWTH 2 DAYS Performed at [facility], [address] [place], [place] [address]    Report Status PENDING  Incomplete  Resp Panel by RT-PCR (Flu A&B, Covid) Nasopharyngeal Swab     Status: None   Collection Time: 01/03/21  2:03 PM   Specimen: Nasopharyngeal Swab; Nasopharyngeal(NP) swabs in vial transport medium  Result Value Ref Range Status   SARS Coronavirus 2 by RT PCR NEGATIVE NEGATIVE Final    Comment: (NOTE) SARS-CoV-2 target nucleic acids are NOT DETECTED.  The SARS-CoV-2 RNA is generally detectable in upper respiratory specimens during the acute phase of infection. The lowest concentration of SARS-CoV-2 viral copies this assay can detect is 138 copies/mL. A negative result does not preclude SARS-Cov-2 infection and should not be used as the sole basis for treatment or other patient management decisions. A negative  result may occur with  improper specimen collection/handling, submission of specimen other than nasopharyngeal swab, presence of viral mutation(s) within the areas targeted by this assay, and inadequate number of viral copies(<138 copies/mL). A negative result must be combined with clinical observations, patient history, and epidemiological information. The expected result is Negative.  Fact Sheet for Patients:  BloggerCourse.com  Fact Sheet for Healthcare Providers:  SeriousBroker.it  This test is no t yet approved or cleared by the [place] FDA and  has been authorized for detection and/or diagnosis of SARS-CoV-2 by FDA under an Emergency Use Authorization (EUA). This EUA will remain  in effect (meaning this test can be used) for the duration of the COVID-19 declaration under Section 564(b)(1) of the Act, 21 U.S.C.section 360bbb-3(b)(1), unless the authorization is terminated  or revoked sooner.       Influenza A by PCR NEGATIVE NEGATIVE Final   Influenza B by PCR NEGATIVE NEGATIVE Final    Comment: (NOTE) The Xpert Xpress SARS-CoV-2/FLU/RSV plus assay is intended as an aid in the diagnosis of influenza from Nasopharyngeal swab specimens and should not be used as a sole basis for treatment. Nasal washings and aspirates are unacceptable for Xpert Xpress SARS-CoV-2/FLU/RSV testing.  Fact Sheet for Patients: BloggerCourse.com  Fact Sheet for Healthcare Providers: SeriousBroker.it  This test is not yet approved or cleared by the [place] FDA and has been authorized for detection and/or diagnosis of SARS-CoV-2 by FDA under an Emergency Use Authorization (EUA). This EUA will remain in effect (meaning this test can be used) for the duration of the COVID-19 declaration under Section 564(b)(1) of the Act, 21 U.S.C. section 360bbb-3(b)(1), unless the authorization is  terminated or revoked.  Performed at [facility], [address] [place], [place] [address]   MRSA PCR Screening     Status: Abnormal   Collection Time: 01/04/21  2:49 AM   Specimen: Nasal Mucosa; Nasopharyngeal  Result Value Ref Range Status   MRSA by PCR POSITIVE (A) NEGATIVE Final    Comment:        The GeneXpert MRSA Assay (FDA approved for NASAL specimens only), is one component of a comprehensive MRSA colonization surveillance program. It is not intended to diagnose MRSA infection nor to guide or monitor treatment for MRSA infections. RESULT CALLED  TO, READ BACK BY AND VERIFIED WITH: Ricki Rodriguez, RN @ [phone] ON 01/04/21 Riesa Pope Performed at [facility], [address], [place], [place] [address]          Radiology Studies: DG Forearm Right  Result Date: 01/03/2021 CLINICAL DATA:  Dog bite. Pain in the midshaft of the forearm. Injury 5 days ago. EXAM: RIGHT FOREARM - 2 VIEW COMPARISON:  None. FINDINGS: There is irregularity of the soft tissues of the mid forearm. No radiopaque foreign body or soft tissue gas. No acute fracture. IMPRESSION: No acute fracture.  Soft tissue irregularity. Electronically Signed   By: Norva Pavlov M.D.   On: 01/03/2021 14:23        Scheduled Meds: . aspirin EC  81 mg Oral Daily  . atorvastatin  20 mg Oral Daily  . Chlorhexidine Gluconate Cloth  6 each Topical Q0600  . divalproex  1,500 mg Oral QHS  . divalproex  500 mg Oral Daily  . fexofenadine  180 mg Oral Daily  . fluticasone  1 spray Each Nare Daily  . insulin aspart  0-15 Units Subcutaneous TID WC  . insulin aspart  0-5 Units Subcutaneous QHS  . insulin detemir  10 Units Subcutaneous QHS  . losartan  50 mg Oral Daily  . multivitamin  1 tablet Oral QPM  . mupirocin ointment  1 application Nasal BID  . omega-3 acid ethyl esters  2 g Oral BID  . pantoprazole  40 mg Oral Daily   Continuous Infusions: . sodium chloride 100 mL/hr at  01/05/21 0753  . ampicillin-sulbactam (UNASYN) IV       LOS: 2 days    Time spent: 40 minutes    Ramiro Harvest, MD [facility]   To contact the attending provider between 7A-7P or the covering provider during after hours 7P-7A, please log into the web site [email or web address] and access using universal [place] password for that web site. If you do not have the password, please call the hospital operator.  01/05/2021, 11:01 AM

## 2021-01-05 NOTE — Progress Notes (Signed)
PHARMACY NOTE -  ANTIBIOTIC RENAL DOSE ADJUSTMENT   Patient has been initiated on Unasyn for dog bite/cellulitis SCr 2.68, estimated CrCl < 30 ml/min  Plan: decrease Unasyn to 3 gm IV q8h  Herby Abraham, Pharm.D 01/05/2021 8:27 AM

## 2021-01-06 DIAGNOSIS — E785 Hyperlipidemia, unspecified: Secondary | ICD-10-CM | POA: Diagnosis not present

## 2021-01-06 DIAGNOSIS — I10 Essential (primary) hypertension: Secondary | ICD-10-CM

## 2021-01-06 DIAGNOSIS — N1832 Chronic kidney disease, stage 3b: Secondary | ICD-10-CM | POA: Diagnosis not present

## 2021-01-06 DIAGNOSIS — W540XXD Bitten by dog, subsequent encounter: Secondary | ICD-10-CM

## 2021-01-06 DIAGNOSIS — J309 Allergic rhinitis, unspecified: Secondary | ICD-10-CM | POA: Diagnosis not present

## 2021-01-06 LAB — CBC WITH DIFFERENTIAL/PLATELET
Abs Immature Granulocytes: 0.04 10*3/uL (ref 0.00–0.07)
Basophils Absolute: 0 10*3/uL (ref 0.0–0.1)
Basophils Relative: 0 %
Eosinophils Absolute: 0.1 10*3/uL (ref 0.0–0.5)
Eosinophils Relative: 2 %
HCT: 33.7 % — ABNORMAL LOW (ref 39.0–52.0)
Hemoglobin: 11 g/dL — ABNORMAL LOW (ref 13.0–17.0)
Immature Granulocytes: 1 %
Lymphocytes Relative: 34 %
Lymphs Abs: 2.1 10*3/uL (ref 0.7–4.0)
MCH: 32.9 pg (ref 26.0–34.0)
MCHC: 32.6 g/dL (ref 30.0–36.0)
MCV: 100.9 fL — ABNORMAL HIGH (ref 80.0–100.0)
Monocytes Absolute: 0.5 10*3/uL (ref 0.1–1.0)
Monocytes Relative: 9 %
Neutro Abs: 3.3 10*3/uL (ref 1.7–7.7)
Neutrophils Relative %: 54 %
Platelets: 168 10*3/uL (ref 150–400)
RBC: 3.34 MIL/uL — ABNORMAL LOW (ref 4.22–5.81)
RDW: 12.9 % (ref 11.5–15.5)
WBC: 6 10*3/uL (ref 4.0–10.5)
nRBC: 0 % (ref 0.0–0.2)

## 2021-01-06 LAB — GLUCOSE, CAPILLARY
Glucose-Capillary: 118 mg/dL — ABNORMAL HIGH (ref 70–99)
Glucose-Capillary: 174 mg/dL — ABNORMAL HIGH (ref 70–99)
Glucose-Capillary: 161 mg/dL — ABNORMAL HIGH (ref 70–99)
Glucose-Capillary: 130 mg/dL — ABNORMAL HIGH (ref 70–99)

## 2021-01-06 LAB — BASIC METABOLIC PANEL
Anion gap: 7 (ref 5–15)
BUN: 36 mg/dL — ABNORMAL HIGH (ref 6–20)
CO2: 21 mmol/L — ABNORMAL LOW (ref 22–32)
Calcium: 8.6 mg/dL — ABNORMAL LOW (ref 8.9–10.3)
Chloride: 113 mmol/L — ABNORMAL HIGH (ref 98–111)
Creatinine, Ser: 2.14 mg/dL — ABNORMAL HIGH (ref 0.61–1.24)
GFR, Estimated: 35 mL/min — ABNORMAL LOW (ref >60)
Glucose, Bld: 154 mg/dL — ABNORMAL HIGH (ref 70–99)
Potassium: 4.5 mmol/L (ref 3.5–5.1)
Sodium: 141 mmol/L (ref 135–145)

## 2021-01-06 NOTE — Consult Note (Signed)
I have been informed that HIV1/2 differentiation tests for confirmation have not been getting done so I have ordered an HIV 1 RNA for confirmation to facilitate results.  A negative RNA would confirm he is negative for HIV.  Gardiner Barefoot, MD

## 2021-01-06 NOTE — Progress Notes (Signed)
PROGRESS NOTE    Adam Bowman  VFI:[id number] DOB: August 23, 1966 DOA: 01/03/2021 PCP: Daisy Floro, MD   Chief Complaint  Patient presents with  . Wound Infection    Brief Narrative: Patient is a 55 year old gentleman history of diabetic nephropathy, hypertension, hyperlipidemia, chronic kidney disease stage IIIb, seizure disorder, GERD, depression presented with a 5-day history of dog bite to the right forearm with subsequent development of redness, pain, swelling and purulent discharge from affected area.  Patient placed on IV antibiotics.  Hand surgery consulted and recommended conservative management with IV antibiotics.   Assessment & Plan:   Principal Problem:   Dog bite Active Problems:   HTN (hypertension)   Type 2 diabetes with nephropathy (HCC)   HLD (hyperlipidemia)   Allergic rhinitis   CKD (chronic kidney disease) stage 3, GFR 30-59 ml/min (HCC)  1 right forearm dog bite/cellulitis -Patient presented with worsening right forearm cellulitis/erythema secondary to dog bite. -Plain films of the right forearm with no acute fracture, soft tissue irregularity. -Afebrile.  Normal white count. -Pungent/ malodorous discharge noted on right forearm wound which per patient ongoing since admission.  -Patient seen in consultation by hand surgery, Dr. Mack Hook who recommended conservative treatment with IV antibiotics at this time and no need for acute surgical intervention. -Per admitting physician patient with recent tetanus vaccination. -Patient's dog noted to be fully vaccinated. -Due to pungent odor or and discharge wound cultures obtained which are pending with preliminary results of rare gram-negative rods, rare gram-positive rods, rare gram-positive cocci.  We will culture wounds. -Improving clinically. -Continue IV Unasyn, gentle hydration, limb elevation, wound care orders as written by hand surgery.   -Tylenol as needed.   -If continued improvement could  likely transition to oral antibiotics of Augmentin in the next 1 to 2 days pending wound culture results..  -Outpatient follow-up with hand surgery.  2.  Hypertension -Controlled on current regimen of Cozaar.   -Diuretics on hold.   -Outpatient follow-up.    3.  Diabetes mellitus type 2 -Hemoglobin A1c 8.3.   -CBG 118 this morning.   -Continue Levemir, SSI.   4.  Hyperlipidemia -Statin, Lovaza.    5.  Allergic rhinitis -Continue Flonase, Allegra.  6.  History of seizures -Seizure-free.   -Continue home regimen Depakote.   7.  Chronic kidney disease stage IIIb -Patient with a slight bump in creatinine from baseline.   -Improved with gentle hydration.   -Avoid nephrotoxins.  8.  Abnormal HIV antibody test -Reflex confirmatory test pending.   DVT prophylaxis: SCD Code Status: Full Family Communication: Updated patient and wife at bedside Disposition:   Status is: Inpatient    Dispo: The patient is from: Home              Anticipated d/c is to: Home              Patient currently on IV antibiotics, awaiting wound culture results, not stable for discharge   Difficult to place patient no       Consultants:   Hand surgery: Dr. Mack Hook 01/03/2021  Procedures:   Plain films of the right forearm 01/03/2021  Antimicrobials:   IV Unasyn 01/03/2021>>>.  IV Rocephin 5/10 /2022x1 dose  IV clindamycin 5/10/ 2022x1 dose   Subjective: Patient laying in bed.  States still with significant odor in the right upper extremity.  Feels swelling and pain is slowly improving.  No chest pain.  No shortness of breath.  Tolerating current diet.  Wife  at bedside.    Objective: Vitals:   01/05/21 0533 01/05/21 1422 01/05/21 2130 01/06/21 0521  BP: 118/75 134/86 (!) 154/84 (!) 146/91  Pulse: 73 87 72 69  Resp: 17 18 18 18   Temp: 98.6 F (37 C) (!) 97.5 F (36.4 C) 98 F (36.7 C) 97.6 F (36.4 C)  TempSrc: Oral Oral Oral Oral  SpO2: 97% 99% 100% 100%     Intake/Output Summary (Last 24 hours) at 01/06/2021 1113 Last data filed at 01/06/2021 1000 Gross per 24 hour  Intake 1917.69 ml  Output 2450 ml  Net -532.31 ml   There were no vitals filed for this visit.  Examination:  General exam: : NAD Respiratory system: CTA B no wheezes, no crackles, no rhonchi.  Normal respiratory effort.  Speaking in full sentences.  Cardiovascular system: Regular rate and rhythm no murmurs rubs or gallops.  No JVD.  No lower extremity edema.  Gastrointestinal system: Abdomen soft, nontender, nondistended, positive bowel sounds.  No rebound.  No guarding. Central nervous system: Alert and oriented. No focal neurological deficits. Extremities: Right upper extremity in bandage.  Skin: No rashes, lesions or ulcers Psychiatry: Judgement and insight appear normal. Mood & affect appropriate.  Data Reviewed: I have personally reviewed following labs and imaging studies  CBC: Recent Labs  Lab 01/03/21 1149 01/04/21 1213 01/05/21 0421 01/06/21 0450  WBC 6.2 5.2 6.5 6.0  NEUTROABS 3.7 2.9 3.9 3.3  HGB 12.8* 10.9* 11.1* 11.0*  HCT 38.4* 32.7* 33.8* 33.7*  MCV 98.5 97.0 100.3* 100.9*  PLT 175 157 162 168    Basic Metabolic Panel: Recent Labs  Lab 01/03/21 1149 01/03/21 1403 01/04/21 1213 01/05/21 0421 01/06/21 0450  NA 138  --  138 138 141  K 4.0  --  3.8 4.2 4.5  CL 106  --  103 107 113*  CO2 18*  --  21* 21* 21*  GLUCOSE 200*  --  183* 145* 154*  BUN 40*  --  43* 48* 36*  CREATININE 2.35*  --  1.97* 2.68* 2.14*  CALCIUM 9.6  --  8.8* 8.8* 8.6*  MG  --  2.2 2.2  --   --   PHOS  --  3.6  --   --   --     GFR: CrCl cannot be calculated (Unknown ideal weight.).  Liver Function Tests: Recent Labs  Lab 01/03/21 1149  AST 30  ALT 33  ALKPHOS 64  BILITOT 0.7  PROT 7.6  ALBUMIN 3.5    CBG: Recent Labs  Lab 01/05/21 0734 01/05/21 1147 01/05/21 1617 01/05/21 2154 01/06/21 0724  GLUCAP 142* 224* 221* 158* 118*     Recent  Results (from the past 240 hour(s))  Culture, blood (routine x 2)     Status: None (Preliminary result)   Collection Time: 01/03/21  1:25 PM   Specimen: BLOOD  Result Value Ref Range Status   Specimen Description   Final    BLOOD RIGHT ANTECUBITAL Performed at [facility], [address], [place], [place]    Special Requests   Final    BOTTLES DRAWN AEROBIC AND ANAEROBIC Blood Culture adequate volume Performed at [facility], [address] [place], [place]    Culture   Final    NO GROWTH 2 DAYS Performed at [facility], [address] [place], [place]    Report Status PENDING  Incomplete  Culture, blood (routine x 2)  Status: None (Preliminary result)   Collection Time: 01/03/21  2:02 PM   Specimen: BLOOD  Result Value Ref Range Status   Specimen Description   Final    BLOOD LEFT ANTECUBITAL Performed at [facility], [address] [place], [place] [address]    Special Requests   Final    BOTTLES DRAWN AEROBIC AND ANAEROBIC Blood Culture adequate volume Performed at [facility], [address] [place], [place] [address]    Culture   Final    NO GROWTH 2 DAYS Performed at [facility], [address] [place], [place] [address]    Report Status PENDING  Incomplete  Resp Panel by RT-PCR (Flu A&B, Covid) Nasopharyngeal Swab     Status: None   Collection Time: 01/03/21  2:03 PM   Specimen: Nasopharyngeal Swab; Nasopharyngeal(NP) swabs in vial transport medium  Result Value Ref Range Status   SARS Coronavirus 2 by RT PCR NEGATIVE NEGATIVE Final    Comment: (NOTE) SARS-CoV-2 target nucleic acids are NOT DETECTED.  The SARS-CoV-2 RNA is generally detectable in upper respiratory specimens during the acute phase of infection. The lowest concentration of SARS-CoV-2 viral copies this assay can detect is 138 copies/mL. A negative result does not preclude  SARS-Cov-2 infection and should not be used as the sole basis for treatment or other patient management decisions. A negative result may occur with  improper specimen collection/handling, submission of specimen other than nasopharyngeal swab, presence of viral mutation(s) within the areas targeted by this assay, and inadequate number of viral copies(<138 copies/mL). A negative result must be combined with clinical observations, patient history, and epidemiological information. The expected result is Negative.  Fact Sheet for Patients:  BloggerCourse.comhttps://www.fda.gov/media/152166/download  Fact Sheet for Healthcare Providers:  SeriousBroker.ithttps://www.fda.gov/media/152162/download  This test is no t yet approved or cleared by the [place] FDA and  has been authorized for detection and/or diagnosis of SARS-CoV-2 by FDA under an Emergency Use Authorization (EUA). This EUA will remain  in effect (meaning this test can be used) for the duration of the COVID-19 declaration under Section 564(b)(1) of the Act, 21 U.S.C.section 360bbb-3(b)(1), unless the authorization is terminated  or revoked sooner.       Influenza A by PCR NEGATIVE NEGATIVE Final   Influenza B by PCR NEGATIVE NEGATIVE Final    Comment: (NOTE) The Xpert Xpress SARS-CoV-2/FLU/RSV plus assay is intended as an aid in the diagnosis of influenza from Nasopharyngeal swab specimens and should not be used as a sole basis for treatment. Nasal washings and aspirates are unacceptable for Xpert Xpress SARS-CoV-2/FLU/RSV testing.  Fact Sheet for Patients: BloggerCourse.comhttps://www.fda.gov/media/152166/download  Fact Sheet for Healthcare Providers: SeriousBroker.ithttps://www.fda.gov/media/152162/download  This test is not yet approved or cleared by the [place] FDA and has been authorized for detection and/or diagnosis of SARS-CoV-2 by FDA under an Emergency Use Authorization (EUA). This EUA will remain in effect (meaning this test can be used) for the duration of  the COVID-19 declaration under Section 564(b)(1) of the Act, 21 U.S.C. section 360bbb-3(b)(1), unless the authorization is terminated or revoked.  Performed at [facility], [address] [place], [place] [address]   MRSA PCR Screening     Status: Abnormal   Collection Time: 01/04/21  2:49 AM   Specimen: Nasal Mucosa; Nasopharyngeal  Result Value Ref Range Status   MRSA by PCR POSITIVE (A) NEGATIVE Final    Comment:        The GeneXpert MRSA Assay (FDA approved for  NASAL specimens only), is one component of a comprehensive MRSA colonization surveillance program. It is not intended to diagnose MRSA infection nor to guide or monitor treatment for MRSA infections. RESULT CALLED TO, READ BACK BY AND VERIFIED WITH: Ricki Rodriguez, RN @ [phone] ON 01/04/21 Riesa Pope Performed at [facility], [address] [place], [place] [address]   Aerobic/Anaerobic Culture w Gram Stain (surgical/deep wound)     Status: None (Preliminary result)   Collection Time: 01/05/21 10:30 AM   Specimen: Wound  Result Value Ref Range Status   Specimen Description   Final    WOUND RIGHT ARM Performed at [facility], [address], [place], [place] [address]    Special Requests NONE  Final   Gram Stain   Final    ABUNDANT WBC PRESENT,BOTH PMN AND MONONUCLEAR RARE GRAM NEGATIVE RODS RARE GRAM POSITIVE RODS RARE GRAM POSITIVE COCCI Performed at [facility], [address], [place], [place] [address]    Culture PENDING  Incomplete   Report Status PENDING  Incomplete         Radiology Studies: No results found.      Scheduled Meds: . aspirin EC  81 mg Oral Daily  . atorvastatin  20 mg Oral Daily  . Chlorhexidine Gluconate Cloth  6 each Topical Q0600  . divalproex  1,500 mg Oral QHS  . divalproex  500 mg Oral Daily  . fexofenadine  180 mg Oral Daily  . fluticasone  1 spray Each Nare Daily  . insulin aspart  0-15 Units Subcutaneous  TID WC  . insulin aspart  0-5 Units Subcutaneous QHS  . insulin detemir  10 Units Subcutaneous QHS  . losartan  50 mg Oral Daily  . multivitamin  1 tablet Oral QPM  . mupirocin ointment  1 application Nasal BID  . omega-3 acid ethyl esters  2 g Oral BID  . pantoprazole  40 mg Oral Daily   Continuous Infusions: . sodium chloride 100 mL/hr at 01/05/21 1655  . ampicillin-sulbactam (UNASYN) IV 3 g (01/06/21 0836)     LOS: 3 days    Time spent: 35 minutes    Ramiro Harvest, MD [facility]   To contact the attending provider between 7A-7P or the covering provider during after hours 7P-7A, please log into the web site [email or web address] and access using universal [place] password for that web site. If you do not have the password, please call the hospital operator.  01/06/2021, 11:13 AM

## 2021-01-07 DIAGNOSIS — T148XXA Other injury of unspecified body region, initial encounter: Secondary | ICD-10-CM | POA: Diagnosis not present

## 2021-01-07 DIAGNOSIS — Z21 Asymptomatic human immunodeficiency virus [HIV] infection status: Secondary | ICD-10-CM

## 2021-01-07 DIAGNOSIS — W540XXS Bitten by dog, sequela: Secondary | ICD-10-CM | POA: Diagnosis not present

## 2021-01-07 DIAGNOSIS — W540XXA Bitten by dog, initial encounter: Secondary | ICD-10-CM | POA: Diagnosis not present

## 2021-01-07 DIAGNOSIS — A280 Pasteurellosis: Secondary | ICD-10-CM | POA: Diagnosis present

## 2021-01-07 DIAGNOSIS — N1832 Chronic kidney disease, stage 3b: Secondary | ICD-10-CM | POA: Diagnosis not present

## 2021-01-07 DIAGNOSIS — L089 Local infection of the skin and subcutaneous tissue, unspecified: Secondary | ICD-10-CM

## 2021-01-07 DIAGNOSIS — J309 Allergic rhinitis, unspecified: Secondary | ICD-10-CM

## 2021-01-07 DIAGNOSIS — E1121 Type 2 diabetes mellitus with diabetic nephropathy: Secondary | ICD-10-CM

## 2021-01-07 LAB — CBC WITH DIFFERENTIAL/PLATELET
Abs Immature Granulocytes: 0.06 10*3/uL (ref 0.00–0.07)
Basophils Absolute: 0 10*3/uL (ref 0.0–0.1)
Basophils Relative: 1 %
Eosinophils Absolute: 0.1 10*3/uL (ref 0.0–0.5)
Eosinophils Relative: 2 %
HCT: 36.9 % — ABNORMAL LOW (ref 39.0–52.0)
Hemoglobin: 11.9 g/dL — ABNORMAL LOW (ref 13.0–17.0)
Immature Granulocytes: 1 %
Lymphocytes Relative: 39 %
Lymphs Abs: 2.9 10*3/uL (ref 0.7–4.0)
MCH: 32.2 pg (ref 26.0–34.0)
MCHC: 32.2 g/dL (ref 30.0–36.0)
MCV: 100 fL (ref 80.0–100.0)
Monocytes Absolute: 0.5 10*3/uL (ref 0.1–1.0)
Monocytes Relative: 7 %
Neutro Abs: 3.9 10*3/uL (ref 1.7–7.7)
Neutrophils Relative %: 50 %
Platelets: 217 10*3/uL (ref 150–400)
RBC: 3.69 MIL/uL — ABNORMAL LOW (ref 4.22–5.81)
RDW: 12.8 % (ref 11.5–15.5)
WBC: 7.6 10*3/uL (ref 4.0–10.5)
nRBC: 0 % (ref 0.0–0.2)

## 2021-01-07 LAB — BASIC METABOLIC PANEL
Anion gap: 9 (ref 5–15)
BUN: 33 mg/dL — ABNORMAL HIGH (ref 6–20)
CO2: 20 mmol/L — ABNORMAL LOW (ref 22–32)
Calcium: 9.1 mg/dL (ref 8.9–10.3)
Chloride: 112 mmol/L — ABNORMAL HIGH (ref 98–111)
Creatinine, Ser: 2.14 mg/dL — ABNORMAL HIGH (ref 0.61–1.24)
GFR, Estimated: 35 mL/min — ABNORMAL LOW (ref >60)
Glucose, Bld: 127 mg/dL — ABNORMAL HIGH (ref 70–99)
Potassium: 4.4 mmol/L (ref 3.5–5.1)
Sodium: 141 mmol/L (ref 135–145)

## 2021-01-07 LAB — GLUCOSE, CAPILLARY
Glucose-Capillary: 112 mg/dL — ABNORMAL HIGH (ref 70–99)
Glucose-Capillary: 187 mg/dL — ABNORMAL HIGH (ref 70–99)

## 2021-01-07 MED ORDER — ACETAMINOPHEN 325 MG PO TABS: 650.0000 mg | ORAL_TABLET | Freq: Four times a day (QID) | ORAL | Status: AC | PRN

## 2021-01-07 MED ORDER — ONDANSETRON HCL 4 MG PO TABS: 4.0000 mg | ORAL_TABLET | Freq: Four times a day (QID) | ORAL | 0 refills | Status: AC | PRN

## 2021-01-07 MED ORDER — FUROSEMIDE 10 MG/ML IJ SOLN
20.0000 mg | Freq: Once | INTRAMUSCULAR | Status: AC
Start: 2021-01-07 — End: 2021-01-07
  Administered 2021-01-07: 20 mg via INTRAVENOUS
  Filled 2021-01-07: qty 2

## 2021-01-07 MED ORDER — AMOXICILLIN-POT CLAVULANATE 875-125 MG PO TABS: 1.0000 | ORAL_TABLET | Freq: Two times a day (BID) | ORAL | 0 refills | Status: DC

## 2021-01-07 MED ORDER — WITCH HAZEL-GLYCERIN EX PADS: MEDICATED_PAD | CUTANEOUS | 12 refills | Status: DC | PRN

## 2021-01-07 MED ORDER — AMOXICILLIN-POT CLAVULANATE 875-125 MG PO TABS
1.0000 | ORAL_TABLET | Freq: Two times a day (BID) | ORAL | Status: DC
  Administered 2021-01-07: 1 via ORAL
  Filled 2021-01-07: qty 1

## 2021-01-07 NOTE — Discharge Summary (Signed)
Physician Discharge Summary  Adam Bowman ZOX:[id number] DOB: [birth year] DOA: 01/03/2021  PCP: Daisy Florooss, Charles Alan, MD  Admit date: 01/03/2021 Discharge date: 01/07/2021  Time spent: 60 minutes  Recommendations for Outpatient Follow-up:  1. Follow-up with Dr. Mack Hookavid Tynslee Bowlds, hand surgery in 2 weeks. 2. Follow-up with Dr. Daiva EvesVan Dam, ID on 01/11/2021 at 4:15 PM to follow-up on lab results for HIV RNA and further recommendations based on lab results.  Also follow-up on dog bite. 3. Follow-up with Daisy Florooss, Charles Alan, MD in 2 weeks.  On follow-up patient will need a basic metabolic profile done to follow-up on electrolytes and renal function.  Patient's diabetes will need to be followed up upon as patient's metformin was discontinued on discharge due to chronic kidney disease.   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  History of present illness:  HPI per Dr. Maryfrances BunnellMadera Tymel B Polio is a 55 y.o. male with medical history significant of diabetic nephropathy, HTN, HLD, CKD stage 3b, seizure disorder, GERD and depression; who presented with 5 days hx of dog bite into his right forearm and subsequent development of redness, pain, swelling and purulent discharge from the affected area. Patient denies CP, SOB, nausea, vomiting, dysuria, hematuria, fever, focl weakness or any other complaints.  ED Course: IV antibiotics initiated, analgesics and IVF's given. X-ray without visible abnormalities. Hand surgery consulted and recommendations for conservative management with IV antibiotics for the next 24-48 hours  recommended.  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 -Plain films of the right forearm with no acute fracture, soft tissue irregularity. -Afebrile.  Normal white count. -Pungent/ malodorous discharge noted on right forearm wound which per patient ongoing since admission.  -Patient seen in consultation by hand surgery, Dr. Mack Hookavid Ihor Meinzer who recommended conservative treatment with IV antibiotics at this time and no need for acute surgical intervention. -Per admitting physician patient with recent tetanus vaccination. -Patient's dog noted to be fully vaccinated. -Due to pungent odor or and discharge, wound cultures obtained grew out Pasteurella multocida. -Patient placed on IV Unasyn during the hospitalization, wound care orders were written by hand surgery which we will continue during the hospitalization. -Patient's pain controlled on Tylenol as needed. -Patient seen by ID who recommended a total of 14 days of antibiotics. -Patient will be discharged on 11 days of Augmentin to complete a 14-day course of antibiotic treatment. -Outpatient follow-up with hand surgery, Dr. Mack Hookavid Breyah Akhter. -Outpatient follow-up with PCP and ID.  2.  Hypertension -Blood pressure remained controlled on home regimen Cozaar.   -Lasix held during the hospitalization will be resumed on discharge.   -Outpatient follow-up with PCP.  3.  Diabetes mellitus type 2 -Hemoglobin A1c 8.3.   -Patient's oral hypoglycemic agents were held during the hospitalization.   -Patient maintained on Levemir and sliding scale insulin with good blood glucose control.   -Patient's metformin will be discontinued on discharge due to chronic kidney disease.  -Patient's other home oral hypoglycemic agents resumed on discharge.  -Outpatient follow-up with PCP.  4.  Hyperlipidemia -Patient maintained on  home regimen statin, Lovaza.    5.  Allergic rhinitis -Patient maintained on Flonase, Allegra.   6.  History of seizures -Patient remained seizure-free during the hospitalization.   -Patient maintained on home regimen Depakote.   7.  Chronic kidney disease stage IIIb -Patient with a slight bump in creatinine from baseline.   -Improved with gentle hydration.   However patient noted to have some lower extremity edema however denied any shortness of breath. -Patient given Lasix 20 mg IV x1 on day of discharge with good urine output. -Patient's home regimen Lasix will be resumed on discharge. -Outpatient follow-up with PCP.   8.  Abnormal HIV antibody test -Reflex confirmatory test pending.  Patient seen in consultation by ID, qualitative RNA, quantitative RNA test was ordered and pending at the time of discharge. -Outpatient follow-up with ID, Dr. Daiva Eves on 01/11/2021.   Procedures:  Plain films of the right forearm 01/03/2021    Consultations:  ID: Dr Daiva Eves 01/07/2021  Hand surgery: Dr. Mack Hook 01/03/2021   Discharge Exam: Vitals:   01/07/21 0630 01/07/21 1328  BP: (!) 145/78 127/79  Pulse: 71 72  Resp: 18 18  Temp: 98 F (36.7 C) 98 F (36.7 C)  SpO2: 99% 98%    General: NAD Cardiovascular: RRR Respiratory: CTAB  Discharge Instructions   Discharge Instructions    Diet - low sodium heart healthy   Complete by: As directed    Discharge wound care:   Complete by: As directed    As above   Increase activity slowly   Complete by: As directed      Allergies as of 01/07/2021      Reactions   Codeine Swelling   Face numbness    Fenofibrate Swelling   Legs turn red and swell   Ibuprofen    Stage 4 Kidney failure. Cannot take.   Procardia [nifedipine] Swelling   Sulfa Antibiotics Swelling      Medication List    STOP taking these medications   metFORMIN 500 MG 24 hr tablet Commonly known as: GLUCOPHAGE-XR     TAKE these medications    acetaminophen 325 MG tablet Commonly known as: TYLENOL Take 2 tablets (650 mg total) by mouth every 6 (six) hours as needed for mild pain (or Fever >/= 101).   ALLERGY 24-HR PO Take 1 tablet by mouth daily.   amoxicillin-clavulanate 875-125 MG tablet Commonly known as: AUGMENTIN Take 1 tablet by mouth every 12 (twelve) hours for 11 days.   aspirin EC 81 MG tablet Take 81 mg daily by mouth.   atorvastatin 20 MG tablet Commonly known as: LIPITOR Take 20 mg daily by mouth.   b complex vitamins tablet Take 1 tablet by mouth at bedtime.   BEE POLLEN PO Take 1 capsule by mouth in the morning and at bedtime.   BL VITAMIN B-12 PO Take 1 tablet by mouth daily.   CINNAMON PO Take 2 capsules by mouth in the morning and at bedtime.   COQ-10 PO Take 1 capsule by mouth daily.   divalproex 500 MG 24 hr tablet Commonly known as: DEPAKOTE ER Take 4 tablets (2,000 mg total) by mouth daily. What changed:   how much to take  when to take this  additional instructions   Fish Oil 1000 MG Caps Take 3 capsules 2 (two) times daily by mouth.   fluticasone 50 MCG/ACT nasal spray Commonly known as: FLONASE Place 1 spray into both  nostrils daily.   furosemide 20 MG tablet Commonly known as: LASIX Take 20 mg by mouth daily.   Green Tea 250 MG Caps Take 1 capsule by mouth 2 (two) times daily.   Jardiance 25 MG Tabs tablet Generic drug: empagliflozin Take 25 mg by mouth daily.   JOINT SUPPORT COMPLEX PO Take 1 capsule by mouth in the morning.   Prostate Caps Take 1 capsule by mouth in the morning.   KRILL OIL PO Take 1 capsule by mouth 2 (two) times daily.   losartan 50 MG tablet Commonly known as: COZAAR Take 50 mg daily by mouth.   MUCINEX PO Take 1 tablet by mouth daily.   multivitamin with minerals Tabs tablet Take 1 tablet daily by mouth.   multivitamin-lutein Caps capsule Take 1 capsule by mouth every evening.   ondansetron 4 MG tablet Commonly known as:  ZOFRAN Take 1 tablet (4 mg total) by mouth every 6 (six) hours as needed for nausea.   pantoprazole 40 MG tablet Commonly known as: PROTONIX Take 40 mg daily by mouth.   pioglitazone 15 MG tablet Commonly known as: ACTOS Take 15 mg by mouth daily.   TESTOSTERONE UNDECANOATE PO Take 2 capsules by mouth daily.   Turmeric 500 MG Caps Take 1,000 mg by mouth in the morning.   Vitamin C 500 MG Chew Chew 4,000 mg by mouth in the morning and at bedtime. Take 8 tablets BID   Vitamin D3 50 MCG (2000 UT) Tabs Take 4,000 Units by mouth at bedtime.   witch hazel-glycerin pad Commonly known as: TUCKS Apply topically as needed for itching.            Discharge Care Instructions  (From admission, onward)         Start     Ordered   01/07/21 0000  Discharge wound care:       Comments: As above   01/07/21 1439         Allergies  Allergen Reactions  . Codeine Swelling    Face numbness   . Fenofibrate Swelling    Legs turn red and swell  . Ibuprofen     Stage 4 Kidney failure. Cannot take.  Marland Kitchen Procardia [Nifedipine] Swelling  . Sulfa Antibiotics Swelling    Follow-up Information    Daisy Floro, MD. Schedule an appointment as soon as possible for a visit in 2 week(s).   Specialty: Family Medicine Contact information: [address] [place] [address] [phone]        Mack Hook, MD. Schedule an appointment as soon as possible for a visit in 2 week(s).   Specialty: Orthopedic Surgery Contact information: [address] [place] [address] [phone]        Daiva Eves, Lisette Grinder, MD Follow up on 01/11/2021.   Specialty: Infectious Diseases Why: f/u at 415pm Contact information: [address] [place] [address] [phone]                The results of significant diagnostics from this hospitalization (including imaging, microbiology, ancillary and laboratory) are listed below for reference.    Significant  Diagnostic Studies: DG Forearm Right  Result Date: 01/03/2021 CLINICAL DATA:  Dog bite. Pain in the midshaft of the forearm. Injury 5 days ago. EXAM: RIGHT FOREARM - 2 VIEW COMPARISON:  None. FINDINGS: There is irregularity of the soft tissues of the mid forearm. No radiopaque foreign body or soft tissue gas. No acute fracture. IMPRESSION: No acute fracture.  Soft tissue irregularity. Electronically Signed   By: Norva Pavlov M.D.   On: 01/03/2021 14:23    Microbiology: Recent Results (from the past 240 hour(s))  Culture, blood (routine x 2)     Status: None (Preliminary result)   Collection Time: 01/03/21  1:25 PM   Specimen: BLOOD  Result Value Ref Range Status   Specimen Description   Final    BLOOD RIGHT ANTECUBITAL Performed at [facility], [address], [place], [place] [address]    Special Requests   Final    BOTTLES DRAWN AEROBIC AND ANAEROBIC Blood Culture adequate volume Performed at [facility], [address], [place], [place] [address]    Culture   Final    NO GROWTH 3 DAYS Performed at [facility], [address] [place], [place] [address]    Report Status PENDING  Incomplete  Culture, blood (routine x 2)     Status: None (Preliminary result)   Collection Time: 01/03/21  2:02 PM   Specimen: BLOOD  Result Value Ref Range Status   Specimen Description   Final    BLOOD LEFT ANTECUBITAL Performed at [facility], [address] [place], [place] [address]    Special Requests   Final    BOTTLES DRAWN AEROBIC AND ANAEROBIC Blood Culture adequate volume Performed at [facility], [address] [place], [place] [address]    Culture   Final    NO GROWTH 3 DAYS Performed at [facility], [address], [place], [place] [address]    Report Status PENDING  Incomplete  Resp Panel by RT-PCR (Flu A&B, Covid) Nasopharyngeal Swab     Status: None   Collection Time: 01/03/21   2:03 PM   Specimen: Nasopharyngeal Swab; Nasopharyngeal(NP) swabs in vial transport medium  Result Value Ref Range Status   SARS Coronavirus 2 by RT PCR NEGATIVE NEGATIVE Final    Comment: (NOTE) SARS-CoV-2 target nucleic acids are NOT DETECTED.  The SARS-CoV-2 RNA is generally detectable in upper respiratory specimens during the acute phase of infection. The lowest concentration of SARS-CoV-2 viral copies this assay can detect is 138 copies/mL. A negative result does not preclude SARS-Cov-2 infection and should not be used as the sole basis for treatment or other patient management decisions. A negative result may occur with  improper specimen collection/handling, submission of specimen other than nasopharyngeal swab, presence of viral mutation(s) within the areas targeted by this assay, and inadequate number of viral copies(<138 copies/mL). A negative result must be combined with clinical observations, patient history, and epidemiological information. The expected result is Negative.  Fact Sheet for Patients:  BloggerCourse.com  Fact Sheet for Healthcare Providers:  SeriousBroker.it  This test is no t yet approved or cleared by the [place] FDA and  has been authorized for detection and/or diagnosis of SARS-CoV-2 by FDA under an Emergency Use Authorization (EUA). This EUA will remain  in effect (meaning this test can be used) for the duration of the COVID-19 declaration under Section 564(b)(1) of the Act, 21 U.S.C.section 360bbb-3(b)(1), unless the authorization is terminated  or revoked sooner.       Influenza A by PCR NEGATIVE NEGATIVE Final   Influenza B by PCR NEGATIVE NEGATIVE Final    Comment: (NOTE) The Xpert Xpress SARS-CoV-2/FLU/RSV plus assay is intended as an aid in the diagnosis of influenza from Nasopharyngeal swab specimens and should not be used as a sole basis for treatment. Nasal  washings and aspirates  are unacceptable for Xpert Xpress SARS-CoV-2/FLU/RSV testing.  Fact Sheet for Patients: BloggerCourse.com  Fact Sheet for Healthcare Providers: SeriousBroker.it  This test is not yet approved or cleared by the [place] FDA and has been authorized for detection and/or diagnosis of SARS-CoV-2 by FDA under an Emergency Use Authorization (EUA). This EUA will remain in effect (meaning this test can be used) for the duration of the COVID-19 declaration under Section 564(b)(1) of the Act, 21 U.S.C. section 360bbb-3(b)(1), unless the authorization is terminated or revoked.  Performed at [facility], [address], [place], [place] [address]   MRSA PCR Screening     Status: Abnormal   Collection Time: 01/04/21  2:49 AM   Specimen: Nasal Mucosa; Nasopharyngeal  Result Value Ref Range Status   MRSA by PCR POSITIVE (A) NEGATIVE Final    Comment:        The GeneXpert MRSA Assay (FDA approved for NASAL specimens only), is one component of a comprehensive MRSA colonization surveillance program. It is not intended to diagnose MRSA infection nor to guide or monitor treatment for MRSA infections. RESULT CALLED TO, READ BACK BY AND VERIFIED WITH: Ricki Rodriguez, RN @ [phone] ON 01/04/21 Riesa Pope Performed at [facility], [address] [place], [place] [address]   Aerobic/Anaerobic Culture w Gram Stain (surgical/deep wound)     Status: None (Preliminary result)   Collection Time: 01/05/21 10:30 AM   Specimen: Wound  Result Value Ref Range Status   Specimen Description   Final    WOUND RIGHT ARM Performed at [facility], [address], [place], [place] [address]    Special Requests NONE  Final   Gram Stain   Final    ABUNDANT WBC PRESENT,BOTH PMN AND MONONUCLEAR RARE GRAM NEGATIVE RODS RARE GRAM POSITIVE RODS RARE GRAM POSITIVE COCCI    Culture   Final    RARE PASTEURELLA  MULTOCIDA Usually susceptible to penicillin and other beta lactam agents,quinolones,macrolides and tetracyclines. CULTURE REINCUBATED FOR BETTER GROWTH Performed at [facility], [address], [place], [place] [address]    Report Status PENDING  Incomplete     Labs: Basic Metabolic Panel: Recent Labs  Lab 01/03/21 1149 01/03/21 1403 01/04/21 1213 01/05/21 0421 01/06/21 0450 01/07/21 0552  NA 138  --  138 138 141 141  K 4.0  --  3.8 4.2 4.5 4.4  CL 106  --  103 107 113* 112*  CO2 18*  --  21* 21* 21* 20*  GLUCOSE 200*  --  183* 145* 154* 127*  BUN 40*  --  43* 48* 36* 33*  CREATININE 2.35*  --  1.97* 2.68* 2.14* 2.14*  CALCIUM 9.6  --  8.8* 8.8* 8.6* 9.1  MG  --  2.2 2.2  --   --   --   PHOS  --  3.6  --   --   --   --    Liver Function Tests: Recent Labs  Lab 01/03/21 1149  AST 30  ALT 33  ALKPHOS 64  BILITOT 0.7  PROT 7.6  ALBUMIN 3.5   No results for input(s): LIPASE, AMYLASE in the last 168 hours. No results for input(s): AMMONIA in the last 168 hours. CBC: Recent Labs  Lab 01/03/21 1149 01/04/21 1213 01/05/21 0421 01/06/21 0450 01/07/21 0552  WBC 6.2 5.2 6.5 6.0 7.6  NEUTROABS 3.7 2.9 3.9 3.3 3.9  HGB 12.8* 10.9* 11.1* 11.0* 11.9*  HCT 38.4*  32.7* 33.8* 33.7* 36.9*  MCV 98.5 97.0 100.3* 100.9* 100.0  PLT 175 157 162 168 217   Cardiac Enzymes: No results for input(s): CKTOTAL, CKMB, CKMBINDEX, TROPONINI in the last 168 hours. BNP: BNP (last 3 results) No results for input(s): BNP in the last 8760 hours.  ProBNP (last 3 results) No results for input(s): PROBNP in the last 8760 hours.  CBG: Recent Labs  Lab 01/06/21 1144 01/06/21 1659 01/06/21 2042 01/07/21 0736 01/07/21 1201  GLUCAP 174* 161* 130* 112* 187*       Signed:  Ramiro Harvest MD.  [facility] 01/07/2021, 2:52 PM

## 2021-01-07 NOTE — Progress Notes (Signed)
Assessment unchanged. Pt and wife verbalized understanding of dc instructions including Medications to resume, wound care, as well as follow up care. Dressing changed prior to leaving, wife verbalized understanding. Discharged to front entrance accompanied by wife and NT.

## 2021-01-07 NOTE — Consult Note (Signed)
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 Janee Morn   Assessment:  1. 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 Preliminary positive HIV fourth-generation assay, [employer]rey assay qualitative RNA quantitative RNA test pending 3. Diabetes mellitus 4. Hypertension  Plan:  1. Agree with transition to Augmentin and providing him with at least enough antibiotics to get him through 14 days of therapy with close follow-up with orthopedic hand surgery and also with myself. 2. I have arranged for follow-up for him to see me this coming Wednesday at 415 in my clinic to review his HIV tests and certainly if he is positive initiate antiviral therapy and then testing for his wife though I suspect this test is going to be a false positive Elisa based on the history.   Adam Bowman has an appointment on 01/11/2021 at 415 with Dr. Daiva Eves   The [facility] is located in the [facility] at  [address] [place] in [place].  [address], which is located to the left of the elevators.  Phone: [phone]  Fax: [phone]  [email or web address]  He should arrive 15 to 30 minutes before his appointment.  Principal Problem:   Dog bite Active Problems:   HTN (hypertension)   Type 2 diabetes with nephropathy (HCC)   HLD (hyperlipidemia)   Allergic rhinitis   CKD (chronic kidney disease) stage 3, GFR 30-59 ml/min (HCC)   Scheduled Meds: . aspirin EC  81 mg Oral Daily  . atorvastatin  20 mg Oral Daily  . Chlorhexidine Gluconate Cloth  6 each Topical Q0600  . divalproex  1,500 mg Oral QHS  . divalproex  500 mg Oral Daily  . fexofenadine  180 mg Oral Daily  . fluticasone  1 spray Each Nare Daily  . furosemide  20 mg Intravenous  Once  . insulin aspart  0-15 Units Subcutaneous TID WC  . insulin aspart  0-5 Units Subcutaneous QHS  . insulin detemir  10 Units Subcutaneous QHS  . losartan  50 mg Oral Daily  . multivitamin  1 tablet Oral QPM  . mupirocin ointment  1 application Nasal BID  . omega-3 acid ethyl esters  2 g Oral BID  . pantoprazole  40 mg Oral Daily   Continuous Infusions: . ampicillin-sulbactam (UNASYN) IV 3 g (01/07/21 0807)   PRN Meds:.acetaminophen **OR** acetaminophen, LORazepam, morphine injection, ondansetron **OR** ondansetron (ZOFRAN) IV, witch hazel-glycerin  HPI: Adam Bowman is a 55 y.o. male with history of diabetes hypertension hyperlipidemia chronic kidney disease who sustained a bite to his forearm from his pitbull.  He was trying to manage with a wound with local antiseptics but had worsening redness swelling and purulent malodorous drainage from the wound.  He came to the ER and the wound was opened up further by Dr. Clarice Pole cultures taken.  They have subsequently yielded Pasteurella multocida.  He had plain films done of the arm and has been but seen by orthopedic hand surgery, Dr. Janee Morn who feels the patient for now should be able to be treated with antibiotics though deep muscular infection has not been ruled out with imaging such as an MRI.  The plan for now is for him to be discharged on oral Augmentin with close follow-up with orthopedic  hand surgery.  In the interim he had also a positive fourth-generation preliminary test for HIV.  He does not appear to have any clear risk factors for HIV infection based on his sexual history and history is lacking any history of intravenous drug use.  Of he and his wife are aware of the fourth-generation test being positive.  I explained in detail the nature of HIV testing and the steps involved.  I personally would prefer if the FDA would change to approving RNA testing for HIV diagnosis so that we would not have these delays in giving  people and answer yes or no whether they have HIV infection.  I spent greater than 80 minutes with the patient including greater than 50% of time in face to face counsel of the patient and his wife, regard the nature of HIV HIV test the regarding nature of Pasteurella infections, antimicrobial and if needed surgical management of these infections, reviewing his radiographic data films personally laboratory data and in coordination of his care.  Review of Systems: Review of Systems  Constitutional: Negative for chills, diaphoresis, fever, malaise/fatigue and weight loss.  HENT: Negative for congestion, hearing loss, sore throat and tinnitus.   Eyes: Negative for blurred vision and double vision.  Respiratory: Negative for cough, sputum production, shortness of breath and wheezing.   Cardiovascular: Negative for chest pain, palpitations and leg swelling.  Gastrointestinal: Negative for abdominal pain, blood in stool, constipation, diarrhea, heartburn, melena, nausea and vomiting.  Genitourinary: Negative for dysuria, flank pain and hematuria.  Musculoskeletal: Positive for myalgias. Negative for back pain, falls and joint pain.  Skin: Negative for itching and rash.  Neurological: Negative for dizziness, sensory change, focal weakness, loss of consciousness, weakness and headaches.  Endo/Heme/Allergies: Does not bruise/bleed easily.  Psychiatric/Behavioral: Negative for depression, memory loss and suicidal ideas. The patient is not nervous/anxious.     History reviewed. No pertinent past medical history.  Social History   Tobacco Use  . Smoking status: Never Smoker  . Smokeless tobacco: Never Used    History reviewed. No pertinent family history. Allergies  Allergen Reactions  . Codeine Swelling    Face numbness   . Fenofibrate Swelling    Legs turn red and swell  . Ibuprofen     Stage 4 Kidney failure. Cannot take.  Marland Kitchen Procardia [Nifedipine] Swelling  . Sulfa Antibiotics Swelling     OBJECTIVE: Blood pressure (!) 145/78, pulse 71, temperature 98 F (36.7 C), temperature source Oral, resp. rate 18, SpO2 99 %.  Physical Exam Constitutional:      General: He is not in acute distress.    Appearance: Normal appearance. He is well-developed. He is not ill-appearing or diaphoretic.  HENT:     Head: Normocephalic and atraumatic.     Right Ear: Hearing and external ear normal.     Left Ear: Hearing and external ear normal.     Nose: No nasal deformity or rhinorrhea.  Eyes:     General: No scleral icterus.    Extraocular Movements: Extraocular movements intact.     Conjunctiva/sclera: Conjunctivae normal.     Right eye: Right conjunctiva is not injected.     Left eye: Left conjunctiva is not injected.     Pupils: Pupils are equal, round, and reactive to light.  Neck:     Vascular: No JVD.  Cardiovascular:     Rate and Rhythm: Normal rate and regular rhythm.     Heart sounds: Normal heart sounds, S1 normal and S2  normal. No murmur heard. No friction rub.  Pulmonary:     Effort: No respiratory distress.     Breath sounds: No wheezing.  Abdominal:     General: Bowel sounds are normal. There is no distension.     Palpations: Abdomen is soft.     Tenderness: There is no abdominal tenderness.  Musculoskeletal:        General: Normal range of motion.     Right shoulder: Normal.     Left shoulder: Normal.     Cervical back: Normal range of motion and neck supple.     Right hip: Normal.     Left hip: Normal.     Right knee: Normal.     Left knee: Normal.  Lymphadenopathy:     Head:     Right side of head: No submandibular, preauricular or posterior auricular adenopathy.     Left side of head: No submandibular, preauricular or posterior auricular adenopathy.     Cervical: No cervical adenopathy.     Right cervical: No superficial or deep cervical adenopathy.    Left cervical: No superficial or deep cervical adenopathy.  Skin:    General: Skin is warm and dry.      Coloration: Skin is not pale.     Findings: No abrasion, bruising, ecchymosis, erythema, lesion or rash.     Nails: There is no clubbing.  Neurological:     General: No focal deficit present.     Mental Status: He is alert and oriented to person, place, and time.     Sensory: No sensory deficit.     Coordination: Coordination normal.     Gait: Gait normal.  Psychiatric:        Attention and Perception: He is attentive.        Mood and Affect: Mood normal.        Speech: Speech normal.        Behavior: Behavior normal. Behavior is cooperative.        Thought Content: Thought content normal.        Judgment: Judgment normal.    Right arm is bandaged  Lab Results Lab Results  Component Value Date   WBC 7.6 01/07/2021   HGB 11.9 (L) 01/07/2021   HCT 36.9 (L) 01/07/2021   MCV 100.0 01/07/2021   PLT 217 01/07/2021    Lab Results  Component Value Date   CREATININE 2.14 (H) 01/07/2021   BUN 33 (H) 01/07/2021   NA 141 01/07/2021   K 4.4 01/07/2021   CL 112 (H) 01/07/2021   CO2 20 (L) 01/07/2021    Lab Results  Component Value Date   ALT 33 01/03/2021   AST 30 01/03/2021   ALKPHOS 64 01/03/2021   BILITOT 0.7 01/03/2021     Microbiology: Recent Results (from the past 240 hour(s))  Culture, blood (routine x 2)     Status: None (Preliminary result)   Collection Time: 01/03/21  1:25 PM   Specimen: BLOOD  Result Value Ref Range Status   Specimen Description   Final    BLOOD RIGHT ANTECUBITAL Performed at [facility], [address] [place], [place] [address]    Special Requests   Final    BOTTLES DRAWN AEROBIC AND ANAEROBIC Blood Culture adequate volume Performed at [facility], [address] [place], [place] [address]    Culture   Final    NO GROWTH 3 DAYS Performed at [facility], [address] [place], [place] [address]    Report Status PENDING  Incomplete  Culture, blood (routine x 2)     Status: None  (Preliminary result)   Collection Time: 01/03/21  2:02 PM   Specimen: BLOOD  Result Value Ref Range Status   Specimen Description   Final    BLOOD LEFT ANTECUBITAL Performed at [facility], [address] [place], [place] [address]    Special Requests   Final    BOTTLES DRAWN AEROBIC AND ANAEROBIC Blood Culture adequate volume Performed at [facility], [address] [place], [place] [address]    Culture   Final    NO GROWTH 3 DAYS Performed at [facility], [address] [place], [place] [address]    Report Status PENDING  Incomplete  Resp Panel by RT-PCR (Flu A&B, Covid) Nasopharyngeal Swab     Status: None   Collection Time: 01/03/21  2:03 PM   Specimen: Nasopharyngeal Swab; Nasopharyngeal(NP) swabs in vial transport medium  Result Value Ref Range Status   SARS Coronavirus 2 by RT PCR NEGATIVE NEGATIVE Final    Comment: (NOTE) SARS-CoV-2 target nucleic acids are NOT DETECTED.  The SARS-CoV-2 RNA is generally detectable in upper respiratory specimens during the acute phase of infection. The lowest concentration of SARS-CoV-2 viral copies this assay can detect is 138 copies/mL. A negative result does not preclude SARS-Cov-2 infection and should not be used as the sole basis for treatment or other patient management decisions. A negative result may occur with  improper specimen collection/handling, submission of specimen other than nasopharyngeal swab, presence of viral mutation(s) within the areas targeted by this assay, and inadequate number of viral copies(<138 copies/mL). A negative result must be combined with clinical observations, patient history, and epidemiological information. The expected result is Negative.  Fact Sheet for Patients:  BloggerCourse.comhttps://www.fda.gov/media/152166/download  Fact Sheet for Healthcare Providers:  SeriousBroker.ithttps://www.fda.gov/media/152162/download  This test is no t yet approved or cleared by the [place] FDA and  has been authorized for detection and/or diagnosis of SARS-CoV-2 by FDA under an Emergency Use Authorization (EUA). This EUA will remain  in effect (meaning this test can be used) for the duration of the COVID-19 declaration under Section 564(b)(1) of the Act, 21 U.S.C.section 360bbb-3(b)(1), unless the authorization is terminated  or revoked sooner.       Influenza A by PCR NEGATIVE NEGATIVE Final   Influenza B by PCR NEGATIVE NEGATIVE Final    Comment: (NOTE) The Xpert Xpress SARS-CoV-2/FLU/RSV plus assay is intended as an aid in the diagnosis of influenza from Nasopharyngeal swab specimens and should not be used as a sole basis for treatment. Nasal washings and aspirates are unacceptable for Xpert Xpress SARS-CoV-2/FLU/RSV testing.  Fact Sheet for Patients: BloggerCourse.comhttps://www.fda.gov/media/152166/download  Fact Sheet for Healthcare Providers: SeriousBroker.ithttps://www.fda.gov/media/152162/download  This test is not yet approved or cleared by the [place] FDA and has been authorized for detection and/or diagnosis of SARS-CoV-2 by FDA under an Emergency Use Authorization (EUA). This EUA will remain in effect (meaning this test can be used) for the duration of the COVID-19 declaration under Section 564(b)(1) of the Act, 21 U.S.C. section 360bbb-3(b)(1), unless the authorization is terminated or revoked.  Performed at [facility], [address] [place], [place] [address]   MRSA PCR Screening     Status: Abnormal   Collection Time: 01/04/21  2:49 AM   Specimen: Nasal Mucosa; Nasopharyngeal  Result Value Ref Range Status   MRSA by PCR  POSITIVE (A) NEGATIVE Final    Comment:        The GeneXpert MRSA Assay (FDA approved for NASAL specimens only), is one component of a comprehensive MRSA colonization surveillance program. It is not intended to diagnose MRSA infection nor to guide or monitor treatment for MRSA infections. RESULT CALLED TO, READ BACK  BY AND VERIFIED WITH: Ricki Rodriguez, RN @ [phone] ON 01/04/21 Riesa Pope Performed at [facility], [address], [place], [place] [address]   Aerobic/Anaerobic Culture w Gram Stain (surgical/deep wound)     Status: None (Preliminary result)   Collection Time: 01/05/21 10:30 AM   Specimen: Wound  Result Value Ref Range Status   Specimen Description   Final    WOUND RIGHT ARM Performed at [facility], [address], [place], [place] [address]    Special Requests NONE  Final   Gram Stain   Final    ABUNDANT WBC PRESENT,BOTH PMN AND MONONUCLEAR RARE GRAM NEGATIVE RODS RARE GRAM POSITIVE RODS RARE GRAM POSITIVE COCCI    Culture   Final    RARE PASTEURELLA MULTOCIDA Usually susceptible to penicillin and other beta lactam agents,quinolones,macrolides and tetracyclines. Performed at [facility], [address], [place], [place] [address]    Report Status PENDING  Incomplete    Acey Lav, MD [facility] for Infectious Disease [facility] [phone] pager  01/07/2021, 9:40 AM

## 2021-01-08 LAB — CULTURE, BLOOD (ROUTINE X 2)
Culture: NO GROWTH
Culture: NO GROWTH
Special Requests: ADEQUATE
Special Requests: ADEQUATE

## 2021-01-08 LAB — HIV-1 RNA QUANT-NO REFLEX-BLD
HIV 1 RNA Quant: <20 copies/mL
LOG10 HIV-1 RNA: UNDETERMINED log10copy/mL

## 2021-01-11 ENCOUNTER — Encounter: Payer: Self-pay | Admitting: Infectious Disease

## 2021-01-11 ENCOUNTER — Other Ambulatory Visit: Payer: Self-pay

## 2021-01-11 ENCOUNTER — Ambulatory Visit (INDEPENDENT_AMBULATORY_CARE_PROVIDER_SITE_OTHER): Payer: PRIVATE HEALTH INSURANCE | Admitting: Infectious Disease

## 2021-01-11 VITALS — BP 133/83 | HR 106 | Temp 99.2°F | Wt 218.0 lb

## 2021-01-11 DIAGNOSIS — Z21 Asymptomatic human immunodeficiency virus [HIV] infection status: Secondary | ICD-10-CM

## 2021-01-11 DIAGNOSIS — E1121 Type 2 diabetes mellitus with diabetic nephropathy: Secondary | ICD-10-CM | POA: Diagnosis not present

## 2021-01-11 DIAGNOSIS — A280 Pasteurellosis: Secondary | ICD-10-CM | POA: Diagnosis not present

## 2021-01-11 LAB — HIV-1/2 AB - DIFFERENTIATION
HIV 1 Ab: NONREACTIVE
HIV 2 Ab: NONREACTIVE
Note: NEGATIVE

## 2021-01-11 LAB — AEROBIC/ANAEROBIC CULTURE W GRAM STAIN (SURGICAL/DEEP WOUND)

## 2021-01-11 MED ORDER — CEFUROXIME AXETIL 500 MG PO TABS: 500.0000 mg | ORAL_TABLET | Freq: Two times a day (BID) | ORAL | 0 refills | Status: AC

## 2021-01-11 NOTE — Progress Notes (Signed)
Subjective:  Chief complaint having loose bowel movements, multiple throughout the day on Augmentin   Patient ID: Adam Bowman, male    DOB: [birth year], 55 y.o.   MRN: [id number]  HPI  55 y.o. male with history of diabetes hypertension hyperlipidemia chronic kidney disease who sustained a bite to his forearm from his pitbull.  He was trying to manage with a wound with local antiseptics but had worsening redness swelling and purulent malodorous drainage from the wound.  He came to the ER and the wound was opened up further by Dr. Clarice Pole cultures taken.  They have subsequently yielded Pasteurella multocida.  He had plain films done of the arm and has been but seen by orthopedic hand surgery, Dr. Janee Morn who feels the patient for now should be able to be treated with antibiotics though deep muscular infection has not been ruled out with imaging such as an MRI.  He also had an incidental fourth-generation preliminary positive HIV test.  We did an HIV quant RNA as well but it was not yet available when he was in the hospital it came back with an undetectable viral load his discriminatory assay finally came back today 8 days after the test was to be performed  He and his wife are looked on my chart and seen in the HIV test was a false positive test.  He has been on Augmentin after having been on Unasyn but this oral antibiotic is bothering him quite a bit and he is having multiple loose bowel movements throughout the day.  He is still not followed up with Dr. Janee Morn from orthopedic hand surgery and which I think he clearly needs to do.      No past medical history on file.  No past surgical history on file.  No family history on file.    Social History   Socioeconomic History  . Marital status: Married    Spouse name: Not on file  . Number of children: Not on file  . Years of education: Not on file  . Highest education level: Not on file  Occupational History  . Not on file   Tobacco Use  . Smoking status: Never Smoker  . Smokeless tobacco: Never Used  Substance and Sexual Activity  . Alcohol use: Not on file  . Drug use: Not on file  . Sexual activity: Not on file  Other Topics Concern  . Not on file  Social History Narrative  . Not on file   Social Determinants of Health   Financial Resource Strain: Not on file  Food Insecurity: Not on file  Transportation Needs: Not on file  Physical Activity: Not on file  Stress: Not on file  Social Connections: Not on file    Allergies  Allergen Reactions  . Codeine Swelling    Face numbness   . Fenofibrate Swelling    Legs turn red and swell  . Ibuprofen     Stage 4 Kidney failure. Cannot take.  Marland Kitchen Procardia [Nifedipine] Swelling  . Sulfa Antibiotics Swelling     Current Outpatient Medications:  .  acetaminophen (TYLENOL) 325 MG tablet, Take 2 tablets (650 mg total) by mouth every 6 (six) hours as needed for mild pain (or Fever >/= 101)., Disp: , Rfl:  .  Ascorbic Acid (VITAMIN C) 500 MG CHEW, Chew 4,000 mg by mouth in the morning and at bedtime. Take 8 tablets BID, Disp: , Rfl:  .  aspirin EC 81 MG tablet, Take 81 mg  daily by mouth., Disp: , Rfl:  .  atorvastatin (LIPITOR) 20 MG tablet, Take 20 mg daily by mouth., Disp: , Rfl:  .  b complex vitamins tablet, Take 1 tablet by mouth at bedtime., Disp: , Rfl:  .  BEE POLLEN PO, Take 1 capsule by mouth in the morning and at bedtime., Disp: , Rfl:  .  cefUROXime (CEFTIN) 500 MG tablet, Take 1 tablet (500 mg total) by mouth 2 (two) times daily with a meal for 21 days., Disp: 42 tablet, Rfl: 0 .  Cholecalciferol (VITAMIN D3) 2000 units TABS, Take 4,000 Units by mouth at bedtime., Disp: , Rfl:  .  CINNAMON PO, Take 2 capsules by mouth in the morning and at bedtime., Disp: , Rfl:  .  Coenzyme Q10 (COQ-10 PO), Take 1 capsule by mouth daily., Disp: , Rfl:  .  Cyanocobalamin (BL VITAMIN B-12 PO), Take 1 tablet by mouth daily., Disp: , Rfl:  .  divalproex  (DEPAKOTE ER) 500 MG 24 hr tablet, Take 4 tablets (2,000 mg total) by mouth daily. (Patient taking differently: Take 500-1,500 mg by mouth in the morning and at bedtime. Take 1 tablet (500 mg) in the morning & Take 3 tablets (1500 mg) at bedtime), Disp: 360 tablet, Rfl: 3 .  Fexofenadine HCl (ALLERGY 24-HR PO), Take 1 tablet by mouth daily., Disp: , Rfl:  .  fluticasone (FLONASE) 50 MCG/ACT nasal spray, Place 1 spray into both nostrils daily., Disp: , Rfl:  .  furosemide (LASIX) 20 MG tablet, Take 20 mg by mouth daily., Disp: , Rfl:  .  Green Tea 250 MG CAPS, Take 1 capsule by mouth 2 (two) times daily., Disp: , Rfl:  .  GuaiFENesin (MUCINEX PO), Take 1 tablet by mouth daily., Disp: , Rfl:  .  JARDIANCE 25 MG TABS tablet, Take 25 mg by mouth daily., Disp: , Rfl:  .  KRILL OIL PO, Take 1 capsule by mouth 2 (two) times daily., Disp: , Rfl:  .  losartan (COZAAR) 50 MG tablet, Take 50 mg daily by mouth., Disp: , Rfl:  .  Misc Natural Products (JOINT SUPPORT COMPLEX PO), Take 1 capsule by mouth in the morning., Disp: , Rfl:  .  Misc Natural Products (PROSTATE) CAPS, Take 1 capsule by mouth in the morning., Disp: , Rfl:  .  Multiple Vitamin (MULTIVITAMIN WITH MINERALS) TABS tablet, Take 1 tablet daily by mouth., Disp: , Rfl:  .  multivitamin-lutein (OCUVITE-LUTEIN) CAPS capsule, Take 1 capsule by mouth every evening., Disp: , Rfl:  .  Omega-3 Fatty Acids (FISH OIL) 1000 MG CAPS, Take 3 capsules 2 (two) times daily by mouth., Disp: , Rfl:  .  pantoprazole (PROTONIX) 40 MG tablet, Take 40 mg daily by mouth., Disp: , Rfl:  .  TESTOSTERONE UNDECANOATE PO, Take 2 capsules by mouth daily., Disp: , Rfl:  .  Turmeric 500 MG CAPS, Take 1,000 mg by mouth in the morning., Disp: , Rfl:  .  ondansetron (ZOFRAN) 4 MG tablet, Take 1 tablet (4 mg total) by mouth every 6 (six) hours as needed for nausea. (Patient not taking: Reported on 01/11/2021), Disp: 20 tablet, Rfl: 0 .  pioglitazone (ACTOS) 15 MG tablet, Take 15 mg  by mouth daily. (Patient not taking: Reported on 01/11/2021), Disp: , Rfl:  .  witch hazel-glycerin (TUCKS) pad, Apply topically as needed for itching. (Patient not taking: Reported on 01/11/2021), Disp: 40 each, Rfl: 12  Review of Systems  Constitutional: Negative for activity change, appetite change, chills,  diaphoresis, fatigue, fever and unexpected weight change.  HENT: Negative for congestion, rhinorrhea, sinus pressure, sneezing, sore throat and trouble swallowing.   Eyes: Negative for photophobia and visual disturbance.  Respiratory: Negative for cough, chest tightness, shortness of breath, wheezing and stridor.   Cardiovascular: Negative for chest pain, palpitations and leg swelling.  Gastrointestinal: Negative for abdominal distention, abdominal pain, anal bleeding, blood in stool, constipation, diarrhea, nausea and vomiting.  Genitourinary: Negative for difficulty urinating, dysuria, flank pain and hematuria.  Musculoskeletal: Negative for arthralgias, back pain, gait problem, joint swelling and myalgias.  Skin: Positive for wound. Negative for color change, pallor and rash.  Neurological: Negative for dizziness, tremors, weakness and light-headedness.  Hematological: Negative for adenopathy. Does not bruise/bleed easily.  Psychiatric/Behavioral: Negative for agitation, behavioral problems, confusion, decreased concentration, dysphoric mood and sleep disturbance.       Objective:   Physical Exam Constitutional:      Appearance: He is well-developed.  HENT:     Head: Normocephalic and atraumatic.  Eyes:     Conjunctiva/sclera: Conjunctivae normal.  Cardiovascular:     Rate and Rhythm: Normal rate and regular rhythm.  Pulmonary:     Effort: Pulmonary effort is normal. No respiratory distress.     Breath sounds: No wheezing.  Abdominal:     General: There is no distension.     Palpations: Abdomen is soft.  Musculoskeletal:        General: No tenderness. Normal range of  motion.     Cervical back: Normal range of motion and neck supple.  Skin:    General: Skin is warm and dry.     Coloration: Skin is not pale.     Findings: No erythema or rash.  Neurological:     General: No focal deficit present.     Mental Status: He is alert and oriented to person, place, and time.  Psychiatric:        Mood and Affect: Mood normal.        Behavior: Behavior normal.        Thought Content: Thought content normal.        Judgment: Judgment normal.    Right fore-arm 01/11/2021:             Assessment & Plan:  Pasteurella multocida soft tissue infection: The area that is still erythematous and a bit edematous I think needs evaluation and needs follow-up with Dr. Janee Morn.  I will switch him over to cefuroxime and see if this helps with his problems with diarrhea that seems to be associated with the Augmentin   False positive HIV test have gone over laboratory data with him.  I spent greater than 30 minutes with the patient including greater than 50% of time in face to face counsel of the patient and his wife and in coordination of his care.

## 2021-01-12 LAB — MISC LABCORP TEST (SEND OUT): Labcorp test code: 83962

## 2021-02-01 ENCOUNTER — Encounter: Payer: Self-pay | Admitting: Infectious Disease

## 2021-05-16 ENCOUNTER — Ambulatory Visit (INDEPENDENT_AMBULATORY_CARE_PROVIDER_SITE_OTHER): Payer: 59 | Admitting: Psychiatry

## 2021-05-16 ENCOUNTER — Other Ambulatory Visit: Payer: Self-pay

## 2021-05-16 ENCOUNTER — Encounter: Payer: Self-pay | Admitting: Psychiatry

## 2021-05-16 DIAGNOSIS — F319 Bipolar disorder, unspecified: Secondary | ICD-10-CM

## 2021-05-16 MED ORDER — DIVALPROEX SODIUM ER 500 MG PO TB24: ORAL_TABLET | ORAL | 3 refills | Status: DC

## 2021-05-16 NOTE — Progress Notes (Signed)
Adam Bowman [id number] 08-Mar-1966 55 y.o.  Subjective:   Patient ID:  Adam Bowman is a 55 y.o. (DOB April 21, 1966) male.  Chief Complaint:  Chief Complaint  Patient presents with   Follow-up   Bipolar I disorder (HCC)    HPI Adam Bowman presents to the office today for follow-up of bipolar disorder.  seen 04/2019 & 04/2020 without med changes.  05/16/21 appt noted: No unusual mood swings.  No SE issues. Consistent with meds. No health problems.  Ongoing chronic family stressors.  Caring for M with dementia.   Usually sleep OK.  Doing fine with mood. No swings.  No illnesses. Remains stable on 2000 mg Depakote daily. No problems with the Depakote.  Wants to have daughters switch to Depakote from lithium bc he's in stage 4 CKD.   Patient reports stable mood and denies depressed.  Less patient than I could be.  Patient denies any recent difficulty with anxiety.  Patient denies difficulty with sleep initiation or maintenance except for physical sx. Denies appetite disturbance.  Patient reports that energy and motivation have been good.  Patient denies any difficulty with concentration.  Patient denies any suicidal ideation. No SE  Past Psychiatric Medication Trials:  Lithium, Depakote 2000,  perphenazine EPS, Wellbutrin  Review of Systems:  Review of Systems  Endocrine: Negative for polyuria.  Genitourinary:        Nocturia  Musculoskeletal:  Positive for arthralgias and myalgias.  Neurological:  Negative for tremors and weakness.   Medications: I have reviewed the patient's current medications.  Current Outpatient Medications  Medication Sig Dispense Refill   acetaminophen (TYLENOL) 325 MG tablet Take 2 tablets (650 mg total) by mouth every 6 (six) hours as needed for mild pain (or Fever >/= 101).     Ascorbic Acid (VITAMIN C) 500 MG CHEW Chew 4,000 mg by mouth in the morning and at bedtime. Take 8 tablets BID     aspirin EC 81 MG tablet Take 81 mg daily by mouth.      atorvastatin (LIPITOR) 20 MG tablet Take 20 mg daily by mouth.     b complex vitamins tablet Take 1 tablet by mouth at bedtime.     BEE POLLEN PO Take 1 capsule by mouth in the morning and at bedtime.     Cholecalciferol (VITAMIN D3) 2000 units TABS Take 4,000 Units by mouth at bedtime.     CINNAMON PO Take 2 capsules by mouth in the morning and at bedtime.     Coenzyme Q10 (COQ-10 PO) Take 1 capsule by mouth daily.     Cyanocobalamin (BL VITAMIN B-12 PO) Take 1 tablet by mouth daily.     Fexofenadine HCl (ALLERGY 24-HR PO) Take 1 tablet by mouth daily.     fluticasone (FLONASE) 50 MCG/ACT nasal spray Place 1 spray into both nostrils daily.     furosemide (LASIX) 20 MG tablet Take 20 mg by mouth daily.     Green Tea 250 MG CAPS Take 1 capsule by mouth 2 (two) times daily.     GuaiFENesin (MUCINEX PO) Take 1 tablet by mouth daily.     JARDIANCE 25 MG TABS tablet Take 25 mg by mouth daily.     KRILL OIL PO Take 1 capsule by mouth 2 (two) times daily.     losartan (COZAAR) 50 MG tablet Take 50 mg daily by mouth.     metFORMIN (GLUCOPHAGE) 1000 MG tablet Take 1,000 mg by mouth 2 (two) times daily with  a meal.     Misc Natural Products (JOINT SUPPORT COMPLEX PO) Take 1 capsule by mouth in the morning.     Misc Natural Products (PROSTATE) CAPS Take 1 capsule by mouth in the morning.     Multiple Vitamin (MULTIVITAMIN WITH MINERALS) TABS tablet Take 1 tablet daily by mouth.     multivitamin-lutein (OCUVITE-LUTEIN) CAPS capsule Take 1 capsule by mouth every evening.     Omega-3 Fatty Acids (FISH OIL) 1000 MG CAPS Take 3 capsules 2 (two) times daily by mouth.     pantoprazole (PROTONIX) 40 MG tablet Take 40 mg daily by mouth.     pioglitazone (ACTOS) 15 MG tablet Take 15 mg by mouth daily.     TESTOSTERONE UNDECANOATE PO Take 2 capsules by mouth daily.     Turmeric 500 MG CAPS Take 1,000 mg by mouth in the morning.     divalproex (DEPAKOTE ER) 500 MG 24 hr tablet Take 1 tablet (500 mg) in the morning  & Take 3 tablets (1500 mg) at bedtime 360 tablet 3   ondansetron (ZOFRAN) 4 MG tablet Take 1 tablet (4 mg total) by mouth every 6 (six) hours as needed for nausea. (Patient not taking: No sig reported) 20 tablet 0   witch hazel-glycerin (TUCKS) pad Apply topically as needed for itching. (Patient not taking: No sig reported) 40 each 12   No current facility-administered medications for this visit.    Medication Side Effects: None  Allergies:  Allergies  Allergen Reactions   Codeine Swelling    Face numbness    Fenofibrate Swelling    Legs turn red and swell   Ibuprofen     Stage 4 Kidney failure. Cannot take.   Procardia [Nifedipine] Swelling   Sulfa Antibiotics Swelling    History reviewed. No pertinent past medical history.  History reviewed. No pertinent family history.  Social History   Socioeconomic History   Marital status: Married    Spouse name: Not on file   Number of children: Not on file   Years of education: Not on file   Highest education level: Not on file  Occupational History   Not on file  Tobacco Use   Smoking status: Never   Smokeless tobacco: Never  Substance and Sexual Activity   Alcohol use: Not on file   Drug use: Not on file   Sexual activity: Not on file  Other Topics Concern   Not on file  Social History Narrative   Not on file   Social Determinants of Health   Financial Resource Strain: Not on file  Food Insecurity: Not on file  Transportation Needs: Not on file  Physical Activity: Not on file  Stress: Not on file  Social Connections: Not on file  Intimate Partner Violence: Not on file    Past Medical History, Surgical history, Social history, and Family history were reviewed and updated as appropriate.   Please see review of systems for further details on the patient's review from today.   Objective:   Physical Exam:  There were no vitals taken for this visit.  Physical Exam Constitutional:      General: He is not in  acute distress.    Appearance: He is well-developed. He is obese.  Musculoskeletal:        General: No deformity.  Neurological:     Mental Status: He is alert and oriented to person, place, and time.     Coordination: Coordination normal.  Psychiatric:  Attention and Perception: Attention and perception normal. He does not perceive auditory or visual hallucinations.        Mood and Affect: Mood normal. Mood is not anxious or depressed. Affect is not labile, blunt, angry or inappropriate.        Speech: Speech normal. Speech is not slurred.        Behavior: Behavior normal.        Thought Content: Thought content normal. Thought content does not include homicidal or suicidal ideation. Thought content does not include homicidal or suicidal plan.        Cognition and Memory: Cognition and memory normal.        Judgment: Judgment normal.     Comments: Insight intact. No delusions.     Lab Review:     Component Value Date/Time   NA 141 01/07/2021 0552   K 4.4 01/07/2021 0552   CL 112 (H) 01/07/2021 0552   CO2 20 (L) 01/07/2021 0552   GLUCOSE 127 (H) 01/07/2021 0552   BUN 33 (H) 01/07/2021 0552   CREATININE 2.14 (H) 01/07/2021 0552   CALCIUM 9.1 01/07/2021 0552   PROT 7.6 01/03/2021 1149   ALBUMIN 3.5 01/03/2021 1149   AST 30 01/03/2021 1149   ALT 33 01/03/2021 1149   ALKPHOS 64 01/03/2021 1149   BILITOT 0.7 01/03/2021 1149   GFRNONAA 35 (L) 01/07/2021 0552       Component Value Date/Time   WBC 7.6 01/07/2021 0552   RBC 3.69 (L) 01/07/2021 0552   HGB 11.9 (L) 01/07/2021 0552   HCT 36.9 (L) 01/07/2021 0552   PLT 217 01/07/2021 0552   MCV 100.0 01/07/2021 0552   MCH 32.2 01/07/2021 0552   MCHC 32.2 01/07/2021 0552   RDW 12.8 01/07/2021 0552   LYMPHSABS 2.9 01/07/2021 0552   MONOABS 0.5 01/07/2021 0552   EOSABS 0.1 01/07/2021 0552   BASOSABS 0.0 01/07/2021 0552    No results found for: POCLITH, LITHIUM   No results found for: PHENYTOIN, PHENOBARB, VALPROATE,  CBMZ   .res Assessment: Plan:    Aleksei was seen today for follow-up and bipolar i disorder (hcc).  Diagnoses and all orders for this visit:  Bipolar I disorder (HCC) -     divalproex (DEPAKOTE ER) 500 MG 24 hr tablet; Take 1 tablet (500 mg) in the morning & Take 3 tablets (1500 mg) at bedtime supportive therapy for longterm stability.  Aware of high relapse risk off the meds.  He agrees to continue. Disc concerns about longterm lithium in the past contributing to his CKD.  Good stability and good response wituout SE.  Continue Depakote ER 500 mg in the AM and 1500 mg at night. Typical male dosage without SE so won't check labs to save on cost.  He agrees.  He agrees.   FU 1 year bc longterm stability.  Meredith Staggers, MD, DFAPA   Please see After Visit Summary for patient specific instructions.  No future appointments.  No orders of the defined types were placed in this encounter.   -------------------------------

## 2022-01-29 IMAGING — DX DG FOREARM 2V*R*
2 series · 2 of 2 positions shown · non-contrast
Comparison: None.

CLINICAL DATA: Dog bite. Pain in the midshaft of the forearm.
Injury 5 days ago.

EXAM:
RIGHT FOREARM - 2 VIEW

[forearm ap]
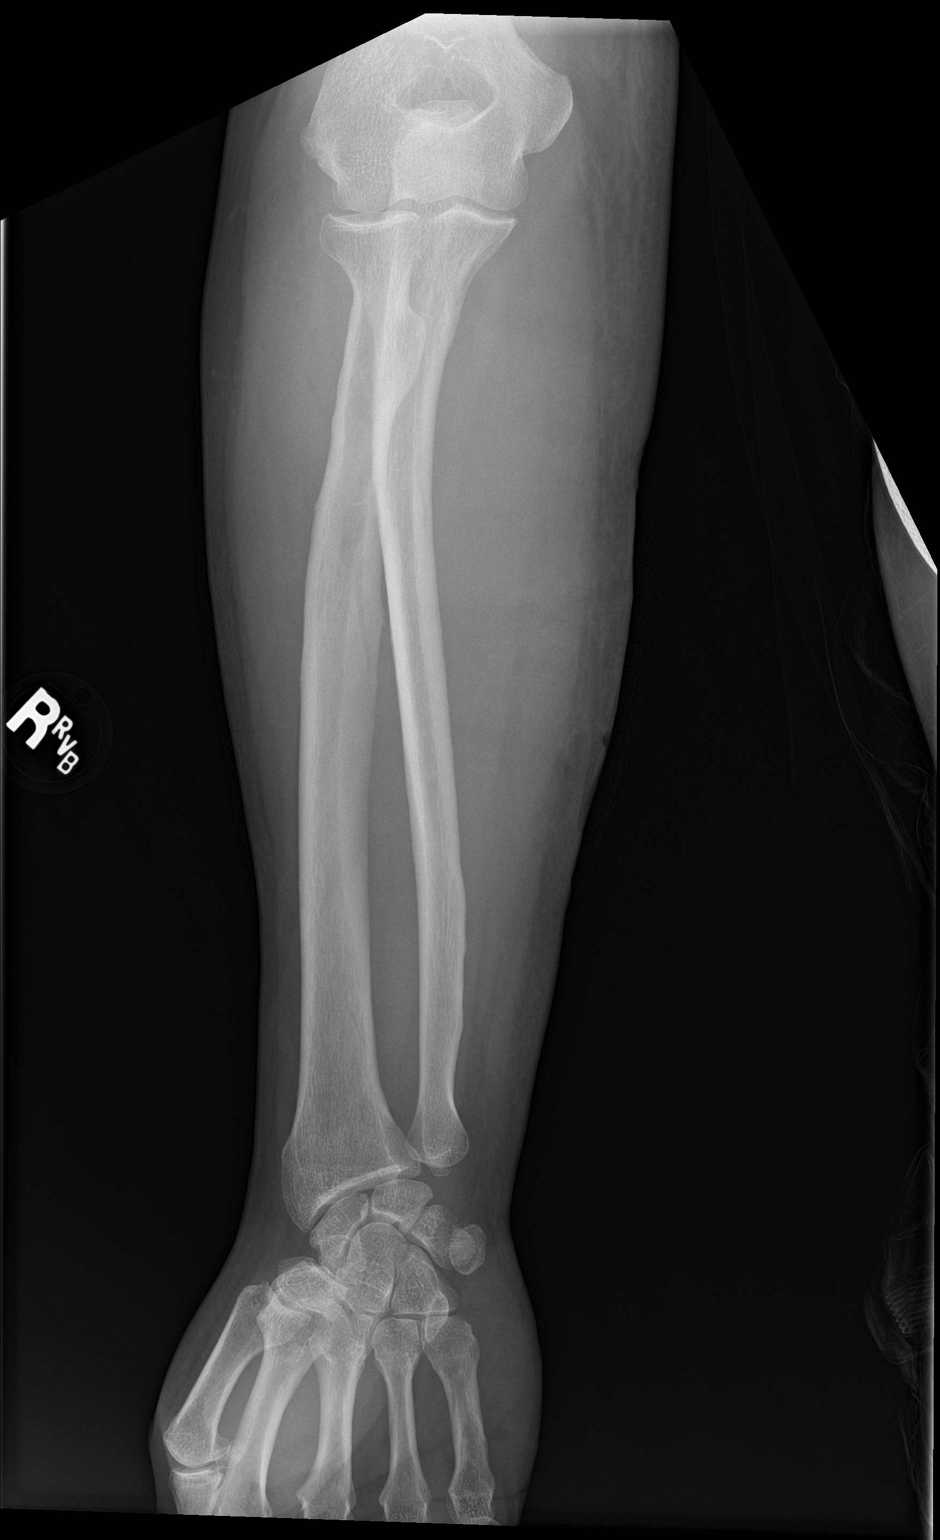

[forearm lat]
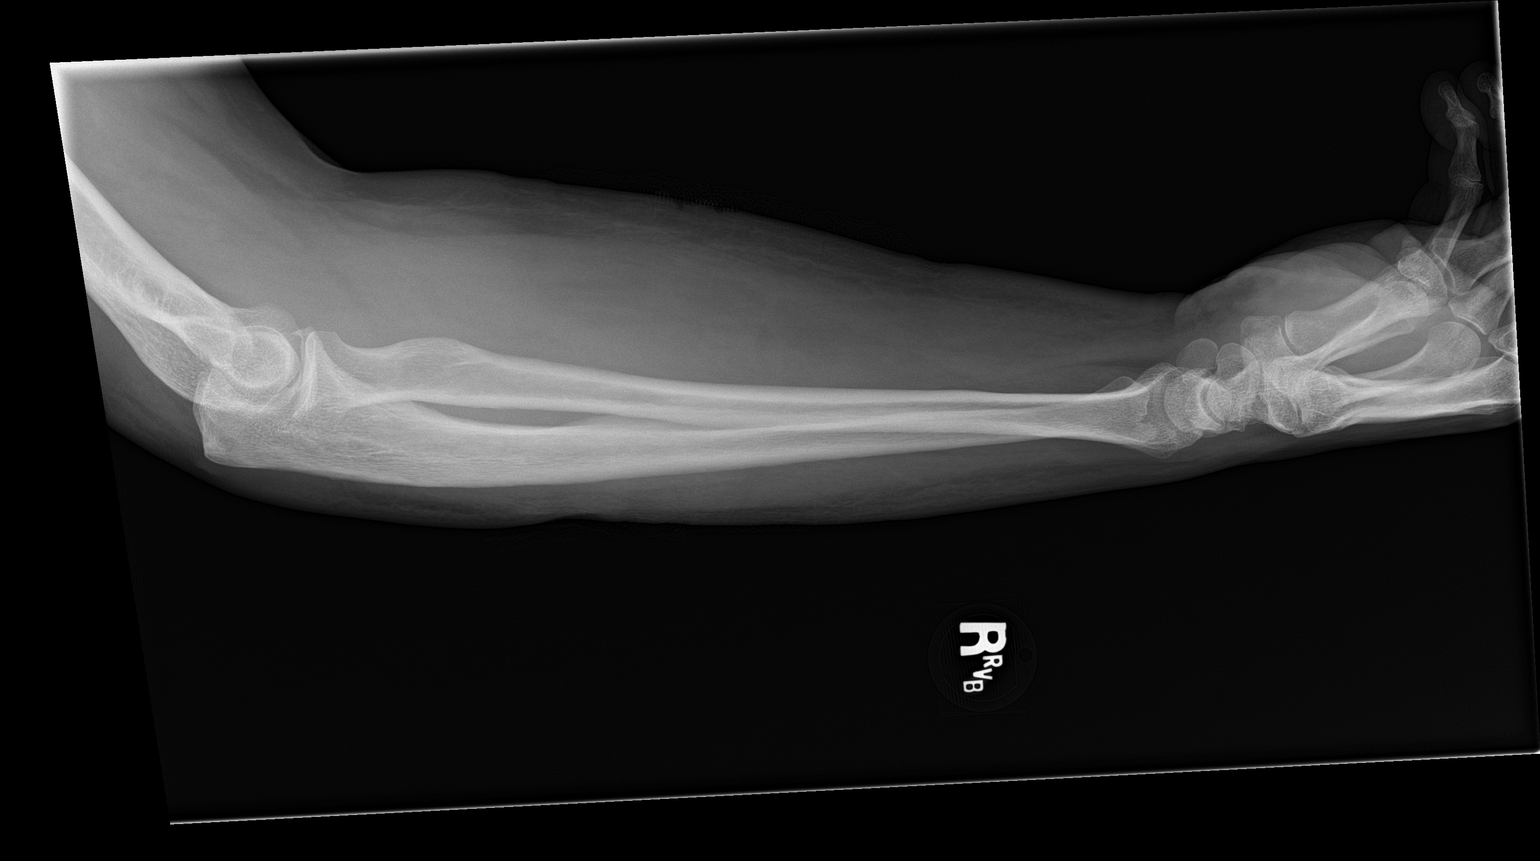

[2 of 2 positions shown; findings below may reference images not displayed]

FINDINGS: There is irregularity of the soft tissues of the mid forearm. No
radiopaque foreign body or soft tissue gas. No acute fracture.
IMPRESSION: No acute fracture.  Soft tissue irregularity.

## 2022-05-14 ENCOUNTER — Ambulatory Visit (INDEPENDENT_AMBULATORY_CARE_PROVIDER_SITE_OTHER): Payer: 59 | Admitting: Psychiatry

## 2022-05-14 ENCOUNTER — Encounter: Payer: Self-pay | Admitting: Psychiatry

## 2022-05-14 DIAGNOSIS — F319 Bipolar disorder, unspecified: Secondary | ICD-10-CM

## 2022-05-14 MED ORDER — DIVALPROEX SODIUM ER 500 MG PO TB24: ORAL_TABLET | ORAL | 3 refills | Status: DC

## 2022-05-14 NOTE — Progress Notes (Signed)
Adam Bowman [id number] [birth year] 56 y.o.  Subjective:   Patient ID:  Adam Bowman is a 56 y.o. (DOB [birth year]) male.  Chief Complaint:  Chief Complaint  Patient presents with   Follow-up   Depression    HPI Adam Bowman presents to the office today for follow-up of bipolar disorder.  seen 04/2019 & 04/2020 without med changes.  05/16/21 appt noted: No unusual mood swings.  No SE issues. Consistent with meds. No health problems.  Ongoing chronic family stressors.  Caring for M with dementia.   Usually sleep OK. No med changes  05/14/22 appt noted: Psychiatric meds include Depakote ER 500 mg in the morning and 1500 mg nightly.  No other psychiatric meds. Lowsy physically.  Stage 4 kidney failure and had to change some other meds. Rebelsis constipation and nausea.   Lost from 227 to 211# since June.   Irritable and thinks physically feeling bad causes some of this. Supervises care of M with dementia.  D's do most of the care.  She is less verbal and fusses at people that aren't there.  Doing fine with mood. No swings.  No illnesses. Remains stable on 2000 mg Depakote daily. No problems with the Depakote.  Wants to have daughters switch to Depakote from lithium bc he's in stage 4 CKD.   Patient reports stable mood and denies depressed.  Less patient than I could be.  Patient denies any recent difficulty with anxiety.  Patient denies difficulty with sleep initiation or maintenance except for physical sx. Denies appetite disturbance.  Patient reports that energy and motivation have been good.  Patient denies any difficulty with concentration.  Patient denies any suicidal ideation. No SE  Past Psychiatric Medication Trials:  Lithium, Depakote 2000,  perphenazine EPS, Wellbutrin  Review of Systems:  Review of Systems  Endocrine: Negative for polyuria.  Genitourinary:        Nocturia  Musculoskeletal:  Positive for arthralgias and myalgias.  Neurological:  Negative for tremors and  weakness.    Medications: I have reviewed the patient's current medications.  Current Outpatient Medications  Medication Sig Dispense Refill   acetaminophen (TYLENOL) 325 MG tablet Take 2 tablets (650 mg total) by mouth every 6 (six) hours as needed for mild pain (or Fever >/= 101).     Ascorbic Acid (VITAMIN C) 500 MG CHEW Chew 4,000 mg by mouth in the morning and at bedtime. Take 8 tablets BID     aspirin EC 81 MG tablet Take 81 mg daily by mouth.     atorvastatin (LIPITOR) 20 MG tablet Take 20 mg daily by mouth.     b complex vitamins tablet Take 1 tablet by mouth at bedtime.     BEE POLLEN PO Take 1 capsule by mouth in the morning and at bedtime.     Cholecalciferol (VITAMIN D3) 2000 units TABS Take 4,000 Units by mouth at bedtime.     CINNAMON PO Take 2 capsules by mouth in the morning and at bedtime.     Coenzyme Q10 (COQ-10 PO) Take 1 capsule by mouth daily.     Cyanocobalamin (BL VITAMIN B-12 PO) Take 1 tablet by mouth daily.     Fexofenadine HCl (ALLERGY 24-HR PO) Take 1 tablet by mouth daily.     fluticasone (FLONASE) 50 MCG/ACT nasal spray Place 1 spray into both nostrils daily.     furosemide (LASIX) 20 MG tablet Take 20 mg by mouth daily.     Green Tea 250 MG  CAPS Take 1 capsule by mouth 2 (two) times daily.     GuaiFENesin (MUCINEX PO) Take 1 tablet by mouth daily.     JARDIANCE 25 MG TABS tablet Take 25 mg by mouth daily.     KRILL OIL PO Take 1 capsule by mouth 2 (two) times daily.     losartan (COZAAR) 50 MG tablet Take 50 mg daily by mouth.     Misc Natural Products (JOINT SUPPORT COMPLEX PO) Take 1 capsule by mouth in the morning.     Misc Natural Products (PROSTATE) CAPS Take 1 capsule by mouth in the morning.     Multiple Vitamin (MULTIVITAMIN WITH MINERALS) TABS tablet Take 1 tablet daily by mouth.     multivitamin-lutein (OCUVITE-LUTEIN) CAPS capsule Take 1 capsule by mouth every evening.     Omega-3 Fatty Acids (FISH OIL) 1000 MG CAPS Take 3 capsules 2 (two) times  daily by mouth.     ondansetron (ZOFRAN) 4 MG tablet Take 1 tablet (4 mg total) by mouth every 6 (six) hours as needed for nausea. 20 tablet 0   pantoprazole (PROTONIX) 40 MG tablet Take 40 mg daily by mouth.     RYBELSUS 3 MG TABS Take 1 tablet by mouth every morning.     TESTOSTERONE UNDECANOATE PO Take 2 capsules by mouth daily.     Turmeric 500 MG CAPS Take 1,000 mg by mouth in the morning.     witch hazel-glycerin (TUCKS) pad Apply topically as needed for itching. 40 each 12   divalproex (DEPAKOTE ER) 500 MG 24 hr tablet Take 1 tablet (500 mg) in the morning & Take 3 tablets (1500 mg) at bedtime 360 tablet 3   No current facility-administered medications for this visit.    Medication Side Effects: None  Allergies:  Allergies  Allergen Reactions   Codeine Swelling    Face numbness    Fenofibrate Swelling    Legs turn red and swell   Ibuprofen     Stage 4 Kidney failure. Cannot take.   Procardia [Nifedipine] Swelling   Sulfa Antibiotics Swelling    History reviewed. No pertinent past medical history.  History reviewed. No pertinent family history.  Social History   Socioeconomic History   Marital status: Married    Spouse name: Not on file   Number of children: Not on file   Years of education: Not on file   Highest education level: Not on file  Occupational History   Not on file  Tobacco Use   Smoking status: Never   Smokeless tobacco: Never  Substance and Sexual Activity   Alcohol use: Not on file   Drug use: Not on file   Sexual activity: Not on file  Other Topics Concern   Not on file  Social History Narrative   Not on file   Social Determinants of Health   Financial Resource Strain: Not on file  Food Insecurity: Not on file  Transportation Needs: Not on file  Physical Activity: Not on file  Stress: Not on file  Social Connections: Not on file  Intimate Partner Violence: Not on file    Past Medical History, Surgical history, Social history, and  Family history were reviewed and updated as appropriate.   Please see review of systems for further details on the patient's review from today.   Objective:   Physical Exam:  There were no vitals taken for this visit.  Physical Exam Constitutional:      General: He is not in  acute distress.    Appearance: He is well-developed. He is obese.  Musculoskeletal:        General: No deformity.  Neurological:     Mental Status: He is alert and oriented to person, place, and time.     Coordination: Coordination normal.  Psychiatric:        Attention and Perception: Attention and perception normal. He does not perceive auditory or visual hallucinations.        Mood and Affect: Mood normal. Mood is not anxious or depressed. Affect is not labile, blunt, angry or inappropriate.        Speech: Speech normal. Speech is not slurred.        Behavior: Behavior normal.        Thought Content: Thought content normal. Thought content is not delusional. Thought content does not include homicidal or suicidal ideation. Thought content does not include suicidal plan.        Cognition and Memory: Cognition and memory normal.        Judgment: Judgment normal.     Comments: Insight intact. No delusions.  Irritable.     Lab Review:     Component Value Date/Time   NA 141 01/07/2021 0552   K 4.4 01/07/2021 0552   CL 112 (H) 01/07/2021 0552   CO2 20 (L) 01/07/2021 0552   GLUCOSE 127 (H) 01/07/2021 0552   BUN 33 (H) 01/07/2021 0552   CREATININE 2.14 (H) 01/07/2021 0552   CALCIUM 9.1 01/07/2021 0552   PROT 7.6 01/03/2021 1149   ALBUMIN 3.5 01/03/2021 1149   AST 30 01/03/2021 1149   ALT 33 01/03/2021 1149   ALKPHOS 64 01/03/2021 1149   BILITOT 0.7 01/03/2021 1149   GFRNONAA 35 (L) 01/07/2021 0552       Component Value Date/Time   WBC 7.6 01/07/2021 0552   RBC 3.69 (L) 01/07/2021 0552   HGB 11.9 (L) 01/07/2021 0552   HCT 36.9 (L) 01/07/2021 0552   PLT 217 01/07/2021 0552   MCV 100.0 01/07/2021  0552   MCH 32.2 01/07/2021 0552   MCHC 32.2 01/07/2021 0552   RDW 12.8 01/07/2021 0552   LYMPHSABS 2.9 01/07/2021 0552   MONOABS 0.5 01/07/2021 0552   EOSABS 0.1 01/07/2021 0552   BASOSABS 0.0 01/07/2021 0552    No results found for: "POCLITH", "LITHIUM"   No results found for: "PHENYTOIN", "PHENOBARB", "VALPROATE", "CBMZ"   .res Assessment: Plan:    Hawthorne was seen today for follow-up and depression.  Diagnoses and all orders for this visit:  Bipolar I disorder ([place]) -     divalproex (DEPAKOTE ER) 500 MG 24 hr tablet; Take 1 tablet (500 mg) in the morning & Take 3 tablets (1500 mg) at bedtime  supportive therapy for longterm stability.  Aware of high relapse risk off the meds.  He agrees to continue. Disc concerns about longterm lithium in the past contributing to his CKD.  Good stability and good response wituout SE.  Continue Depakote ER 500 mg in the AM and 1500 mg at night. Typical male dosage without SE so won't check labs to save on cost.  He agrees.  Consider addtion for irritability.  He's open to consider but wants to wait for adjustment to new DM meds.    Constipation management 1.  Lots of water 2.  Powdered fiber supplement such as MiraLAX, Citrucel, etc. preferably with a meal 3.  2 stool softeners a day 4.  Milk of magnesia or magnesium tablets if needed  He agrees.   FU 1 year bc longterm stability.  Meredith Staggers, MD, DFAPA   Please see After Visit Summary for patient specific instructions.  No future appointments.  No orders of the defined types were placed in this encounter.   -------------------------------

## 2023-04-24 ENCOUNTER — Other Ambulatory Visit: Payer: Self-pay | Admitting: Psychiatry

## 2023-04-24 DIAGNOSIS — F319 Bipolar disorder, unspecified: Secondary | ICD-10-CM

## 2023-05-16 ENCOUNTER — Ambulatory Visit (INDEPENDENT_AMBULATORY_CARE_PROVIDER_SITE_OTHER): Payer: 59 | Admitting: Psychiatry

## 2023-05-16 ENCOUNTER — Encounter: Payer: Self-pay | Admitting: Psychiatry

## 2023-05-16 DIAGNOSIS — F319 Bipolar disorder, unspecified: Secondary | ICD-10-CM

## 2023-05-16 NOTE — Progress Notes (Signed)
[name] [id number] 1966-02-02 57 y.o.  Subjective:   Patient ID:  Adam Bowman is a 57 y.o. (DOB 15-Sep-1965) male.  Chief Complaint:  Chief Complaint  Patient presents with   Follow-up    Mood and meds    HPI [name] presents to the office today for follow-up of bipolar disorder.  seen 04/2019 & 04/2020 without med changes.  05/16/21 appt noted: No unusual mood swings.  No SE issues. Consistent with meds. No health problems.  Ongoing chronic family stressors.  Caring for M with dementia.   Usually sleep OK. No med changes  05/14/22 appt noted: Psychiatric meds include Depakote ER 500 mg in the morning and 1500 mg nightly.  No other psychiatric meds. Lowsy physically.  Stage 4 kidney failure and had to change some other meds. Rebelsis constipation and nausea.   Lost from 227 to 211# since June.   Irritable and thinks physically feeling bad causes some of this. Supervises care of M with dementia.  D's do most of the care.  She is less verbal and fusses at people that aren't there. Doing fine with mood. No swings.  No illnesses. Remains stable on 2000 mg Depakote daily. No problems with the Depakote.  Wants to have daughters switch to Depakote from lithium bc he's in stage 4 CKD.   Patient reports stable mood and denies depressed.  Less patient than I could be.  Patient denies any recent difficulty with anxiety.  Patient denies difficulty with sleep initiation or maintenance except for physical sx. Denies appetite disturbance.  Patient reports that energy and motivation have been good.  Patient denies any difficulty with concentration.  Patient denies any suicidal ideation. No SE  05/16/2023: Appointment noted: Of note is children complain that he is lethargic, inactive and more forgetful lately.  He has been more dependent on his daughters than in the past. Off testosterone bc didn't think it was helpful.  No different off it for a couple of mos. Plugging along.   Doesn't  follow glucose.  Lost 30#.  Was having N on Rebelsius.   Took Ozempic then [place] now.   No sig change in conc, memory noticed lately.  Can word search in conversation.   When taken B caps had better energy, but stopped DT costs.          Past Psychiatric Medication Trials:  Lithium, Depakote 2000,  perphenazine EPS, Wellbutrin  Review of Systems:  Review of Systems  Endocrine: Negative for polyuria.  Genitourinary:        Nocturia  Musculoskeletal:  Positive for arthralgias and myalgias.  Neurological:  Negative for tremors and weakness.   F and GF had tremor and F had brain implants.    Medications: I have reviewed the patient's current medications.  Current Outpatient Medications  Medication Sig Dispense Refill   acetaminophen (TYLENOL) 325 MG tablet Take 2 tablets (650 mg total) by mouth every 6 (six) hours as needed for mild pain (or Fever >/= 101).     Ascorbic Acid (VITAMIN C) 500 MG CHEW Chew 4,000 mg by mouth in the morning and at bedtime. Take 8 tablets BID     aspirin EC 81 MG tablet Take 81 mg daily by mouth.     atorvastatin (LIPITOR) 20 MG tablet Take 20 mg daily by mouth.     b complex vitamins tablet Take 1 tablet by mouth at bedtime.     BEE POLLEN PO Take 1 capsule by mouth in  the morning and at bedtime.     Cholecalciferol (VITAMIN D3) 2000 units TABS Take 4,000 Units by mouth at bedtime.     CINNAMON PO Take 2 capsules by mouth in the morning and at bedtime.     Coenzyme Q10 (COQ-10 PO) Take 1 capsule by mouth daily.     colchicine 0.6 MG tablet Take 0.6 mg by mouth daily.     Cyanocobalamin (BL VITAMIN B-12 PO) Take 1 tablet by mouth daily.     divalproex (DEPAKOTE ER) 500 MG 24 hr tablet TAKE ONE TABLET BY MOUTH DAILY IN THE MORNING AND THREE TABLETS AT BEDTIME 360 tablet 0   febuxostat (ULORIC) 40 MG tablet Take 40 mg by mouth daily.     Fexofenadine HCl (ALLERGY 24-HR PO) Take 1 tablet by mouth daily.     fluticasone (FLONASE) 50 MCG/ACT nasal spray  Place 1 spray into both nostrils daily.     furosemide (LASIX) 20 MG tablet Take 20 mg by mouth daily.     Green Tea 250 MG CAPS Take 1 capsule by mouth 2 (two) times daily.     GuaiFENesin (MUCINEX PO) Take 1 tablet by mouth daily.     JARDIANCE 25 MG TABS tablet Take 25 mg by mouth daily.     KRILL OIL PO Take 1 capsule by mouth 2 (two) times daily.     losartan (COZAAR) 50 MG tablet Take 50 mg daily by mouth.     Misc Natural Products (JOINT SUPPORT COMPLEX PO) Take 1 capsule by mouth in the morning.     Misc Natural Products (PROSTATE) CAPS Take 1 capsule by mouth in the morning.     Multiple Vitamin (MULTIVITAMIN WITH MINERALS) TABS tablet Take 1 tablet daily by mouth.     multivitamin-lutein (OCUVITE-LUTEIN) CAPS capsule Take 1 capsule by mouth every evening.     Omega-3 Fatty Acids (FISH OIL) 1000 MG CAPS Take 3 capsules 2 (two) times daily by mouth.     ondansetron (ZOFRAN) 4 MG tablet Take 1 tablet (4 mg total) by mouth every 6 (six) hours as needed for nausea. 20 tablet 0   pantoprazole (PROTONIX) 40 MG tablet Take 40 mg daily by mouth.     RYBELSUS 3 MG TABS Take 1 tablet by mouth every morning.     Turmeric 500 MG CAPS Take 1,000 mg by mouth in the morning.     witch hazel-glycerin (TUCKS) pad Apply topically as needed for itching. 40 each 12   TESTOSTERONE UNDECANOATE PO Take 2 capsules by mouth daily. (Patient not taking: Reported on 05/16/2023)     No current facility-administered medications for this visit.    Medication Side Effects: None  Allergies:  Allergies  Allergen Reactions   Codeine Swelling    Face numbness    Fenofibrate Swelling    Legs turn red and swell   Ibuprofen     Stage 4 Kidney failure. Cannot take.   Procardia [Nifedipine] Swelling   Sulfa Antibiotics Swelling    History reviewed. No pertinent past medical history.  History reviewed. No pertinent family history.  Social History   Socioeconomic History   Marital status: Married    Spouse  name: Not on file   Number of children: Not on file   Years of education: Not on file   Highest education level: Not on file  Occupational History   Not on file  Tobacco Use   Smoking status: Never   Smokeless tobacco: Never  Substance and  Sexual Activity   Alcohol use: Not on file   Drug use: Not on file   Sexual activity: Not on file  Other Topics Concern   Not on file  Social History Narrative   Not on file   Social Determinants of Health   Financial Resource Strain: Not on file  Food Insecurity: Not on file  Transportation Needs: Not on file  Physical Activity: Not on file  Stress: Not on file  Social Connections: Not on file  Intimate Partner Violence: Not on file    Past Medical History, Surgical history, Social history, and Family history were reviewed and updated as appropriate.   Please see review of systems for further details on the patient's review from today.   Objective:   Physical Exam:  There were no vitals taken for this visit.  Physical Exam Constitutional:      General: He is not in acute distress.    Appearance: He is well-developed. He is obese.  Musculoskeletal:        General: No deformity.  Neurological:     Mental Status: He is alert and oriented to person, place, and time.     Coordination: Coordination normal.  Psychiatric:        Attention and Perception: Attention and perception normal. He does not perceive auditory or visual hallucinations.        Mood and Affect: Mood normal. Mood is not anxious or depressed. Affect is not labile, blunt, angry or inappropriate.        Speech: Speech normal.        Behavior: Behavior normal.        Thought Content: Thought content normal. Thought content is not delusional. Thought content does not include homicidal or suicidal ideation. Thought content does not include suicidal plan.        Cognition and Memory: Cognition and memory normal.        Judgment: Judgment normal.     Comments: Insight  intact. No delusions.  Irritable.     Lab Review:     Component Value Date/Time   NA 141 01/07/2021 0552   K 4.4 01/07/2021 0552   CL 112 (H) 01/07/2021 0552   CO2 20 (L) 01/07/2021 0552   GLUCOSE 127 (H) 01/07/2021 0552   BUN 33 (H) 01/07/2021 0552   CREATININE 2.14 (H) 01/07/2021 0552   CALCIUM 9.1 01/07/2021 0552   PROT 7.6 01/03/2021 1149   ALBUMIN 3.5 01/03/2021 1149   AST 30 01/03/2021 1149   ALT 33 01/03/2021 1149   ALKPHOS 64 01/03/2021 1149   BILITOT 0.7 01/03/2021 1149   GFRNONAA 35 (L) 01/07/2021 0552       Component Value Date/Time   WBC 7.6 01/07/2021 0552   RBC 3.69 (L) 01/07/2021 0552   HGB 11.9 (L) 01/07/2021 0552   HCT 36.9 (L) 01/07/2021 0552   PLT 217 01/07/2021 0552   MCV 100.0 01/07/2021 0552   MCH 32.2 01/07/2021 0552   MCHC 32.2 01/07/2021 0552   RDW 12.8 01/07/2021 0552   LYMPHSABS 2.9 01/07/2021 0552   MONOABS 0.5 01/07/2021 0552   EOSABS 0.1 01/07/2021 0552   BASOSABS 0.0 01/07/2021 0552    No results found for: "POCLITH", "LITHIUM"   No results found for: "PHENYTOIN", "PHENOBARB", "VALPROATE", "CBMZ"   .res Assessment: Plan:    Damar "Scharlene Corn" was seen today for follow-up.  Diagnoses and all orders for this visit:  Bipolar I disorder (HCC)   supportive therapy for longterm stability.  Aware of  high relapse risk off the meds.  He agrees to continue. Disc concerns about longterm lithium in the past contributing to his CKD.  Good stability and good response wituout SE.  Continue Depakote ER 500 mg in the AM and 1500 mg at night. Check VPA level and ammonia level bc tremor and lethargy family notices.  Disc tremors and caffeine and propranolol.   Consider addtion for irritability.  He's open to consider but wants to wait for adjustment to new DM meds.    Constipation management 1.  Lots of water 2.  Powdered fiber supplement such as MiraLAX, Citrucel, etc. preferably with a meal 3.  2 stool softeners a day 4.  Milk of  magnesia or magnesium tablets if needed  If labs are normal then RX propranolol for tremor.   Disc risk in DM And also if ammonia normal then suggest NAC for cog concerns   He agrees.   FU 1 year bc longterm stability.  Meredith Staggers, MD, DFAPA   Please see After Visit Summary for patient specific instructions.  No future appointments.   No orders of the defined types were placed in this encounter.   -------------------------------

## 2023-05-16 NOTE — Patient Instructions (Signed)
(  NAC) N-Acetylcysteine 2 of the  600 mg capsules daily to help with mild cognitive problems.  It can be combined with a B-complex vitamin as the B-12 and folate which can sometimes enhance the effect.

## 2023-05-24 ENCOUNTER — Encounter: Payer: Self-pay | Admitting: Psychiatry

## 2023-05-25 LAB — AMMONIA: Ammonia: 38 ug/dL — ABNORMAL LOW (ref 40–200)

## 2023-05-25 LAB — VALPROIC ACID LEVEL: Valproic Acid Lvl: 88 ug/mL (ref 50–100)

## 2023-05-28 NOTE — Progress Notes (Signed)
He is on valproic acid 500 mg tablets also: Depakote, 1 in the morning and 3 at night. His valproic acid blood level was 88 which is in the high normal range. Because of fatigue we checked his ammonia level which was normal. Therefore because his mood is stable with Depakote, but he is having problems with fatigue let's have him reduce the Depakote to 1 in the morning and 2 at night to see if his energy gets any better.  Have him let us know if he has any increased mood instability and particularly irritability or anger problems.

## 2023-07-15 ENCOUNTER — Other Ambulatory Visit: Payer: Self-pay | Admitting: Psychiatry

## 2023-07-15 DIAGNOSIS — F319 Bipolar disorder, unspecified: Secondary | ICD-10-CM

## 2023-07-15 NOTE — Telephone Encounter (Signed)
HAS APPT TOMORROW 11/19

## 2023-07-16 ENCOUNTER — Ambulatory Visit: Payer: 59 | Admitting: Psychiatry

## 2023-07-16 ENCOUNTER — Encounter: Payer: Self-pay | Admitting: Psychiatry

## 2023-07-16 DIAGNOSIS — F319 Bipolar disorder, unspecified: Secondary | ICD-10-CM | POA: Diagnosis not present

## 2023-07-16 DIAGNOSIS — G250 Essential tremor: Secondary | ICD-10-CM | POA: Diagnosis not present

## 2023-07-16 MED ORDER — NAC 600 MG PO CAPS: 1200.0000 mg | ORAL_CAPSULE | Freq: Every morning | ORAL | 2 refills | Status: AC

## 2023-07-16 MED ORDER — PROPRANOLOL HCL 10 MG PO TABS: ORAL_TABLET | ORAL | 1 refills | Status: DC

## 2023-07-16 NOTE — Progress Notes (Signed)
Adam Bowman [id number] Aug 12, 1966 57 y.o.  Subjective:   Patient ID:  Adam Bowman is a 57 y.o. (DOB [birth year]) male.  Chief Complaint:  Chief Complaint  Patient presents with   Follow-up   Depression   Anxiety    HPI Adam Bowman presents to the office today for follow-up of bipolar disorder.  seen 04/2019 & 04/2020 without med changes.  05/16/21 appt noted: No unusual mood swings.  No SE issues. Consistent with meds. No health problems.  Ongoing chronic family stressors.  Caring for M with dementia.   Usually sleep OK. No med changes  05/14/22 appt noted: Psychiatric meds include Depakote ER 500 mg in the morning and 1500 mg nightly.  No other psychiatric meds. Lowsy physically.  Stage 4 kidney failure and had to change some other meds. Rebelsis constipation and nausea.   Lost from 227 to 211# since June.   Irritable and thinks physically feeling bad causes some of this. Supervises care of M with dementia.  D's do most of the care.  She is less verbal and fusses at people that aren't there. Doing fine with mood. No swings.  No illnesses. Remains stable on 2000 mg Depakote daily. No problems with the Depakote.  Wants to have daughters switch to Depakote from lithium bc he's in stage 4 CKD.   Patient reports stable mood and denies depressed.  Less patient than I could be.  Patient denies any recent difficulty with anxiety.  Patient denies difficulty with sleep initiation or maintenance except for physical sx. Denies appetite disturbance.  Patient reports that energy and motivation have been good.  Patient denies any difficulty with concentration.  Patient denies any suicidal ideation. No SE  05/16/2023: Appointment noted: Of note is children complain that he is lethargic, inactive and more forgetful lately.  He has been more dependent on his daughters than in the past. Off testosterone bc didn't think it was helpful.  No different off it for a couple of mos. Plugging along.    Doesn't follow glucose.  Lost 30#.  Was having N on Rebelsius.   Took Ozempic then [place] now.   No sig change in conc, memory noticed lately.  Can word search in conversation.   When taken B caps had better energy, but stopped DT costs.  Plan: Continue Depakote ER 500 mg in the AM and 1500 mg at night. Check VPA level and ammonia level bc tremor and lethargy family notices.  07/16/23 appt noted:  Reduced Depakote to 1500 mg daily and W thinks he's sleeping more.  Not more irritable as far as he notices.     Still gets shakes at times and asks about trying propranolol for intermittent tremor.  No difference in tremor with reduction in VPA.   Tremor affects writing.       Past Psychiatric Medication Trials:  Lithium, Depakote 2000,  perphenazine EPS, Wellbutrin  GF and F had tremor and F had DBS for tremor  Review of Systems:  Review of Systems  Endocrine: Negative for polyuria.  Genitourinary:        Nocturia  Musculoskeletal:  Positive for arthralgias and myalgias.  Neurological:  Negative for tremors and weakness.   F and GF had tremor and F had brain implants.    Medications: I have reviewed the patient's current medications.  Current Outpatient Medications  Medication Sig Dispense Refill   acetaminophen (TYLENOL) 325 MG tablet Take 2 tablets (650 mg total) by mouth every 6 (six) hours  as needed for mild pain (or Fever >/= 101).     Acetylcysteine (NAC) 600 MG CAPS Take 2 capsules (1,200 mg total) by mouth every morning. 60 capsule 2   Ascorbic Acid (VITAMIN C) 500 MG CHEW Chew 4,000 mg by mouth in the morning and at bedtime. Take 8 tablets BID     aspirin EC 81 MG tablet Take 81 mg daily by mouth.     atorvastatin (LIPITOR) 20 MG tablet Take 20 mg daily by mouth.     b complex vitamins tablet Take 1 tablet by mouth at bedtime.     BEE POLLEN PO Take 1 capsule by mouth in the morning and at bedtime.     Cholecalciferol (VITAMIN D3) 2000 units TABS Take 4,000 Units by  mouth at bedtime.     CINNAMON PO Take 2 capsules by mouth in the morning and at bedtime.     Coenzyme Q10 (COQ-10 PO) Take 1 capsule by mouth daily.     colchicine 0.6 MG tablet Take 0.6 mg by mouth daily.     Cyanocobalamin (BL VITAMIN B-12 PO) Take 1 tablet by mouth daily.     divalproex (DEPAKOTE ER) 500 MG 24 hr tablet TAKE ONE TABLET BY MOUTH DAILY IN THE MORNING AND THREE TABLETS AT BEDTIME (Patient taking differently: One tablet in the AM and 2 tablets at night) 360 tablet 0   febuxostat (ULORIC) 40 MG tablet Take 40 mg by mouth daily.     Fexofenadine HCl (ALLERGY 24-HR PO) Take 1 tablet by mouth daily.     fluticasone (FLONASE) 50 MCG/ACT nasal spray Place 1 spray into both nostrils daily.     furosemide (LASIX) 20 MG tablet Take 20 mg by mouth daily.     Green Tea 250 MG CAPS Take 1 capsule by mouth 2 (two) times daily.     GuaiFENesin (MUCINEX PO) Take 1 tablet by mouth daily.     JARDIANCE 25 MG TABS tablet Take 25 mg by mouth daily.     KRILL OIL PO Take 1 capsule by mouth 2 (two) times daily.     losartan (COZAAR) 50 MG tablet Take 50 mg daily by mouth.     Misc Natural Products (JOINT SUPPORT COMPLEX PO) Take 1 capsule by mouth in the morning.     Misc Natural Products (PROSTATE) CAPS Take 1 capsule by mouth in the morning.     Multiple Vitamin (MULTIVITAMIN WITH MINERALS) TABS tablet Take 1 tablet daily by mouth.     multivitamin-lutein (OCUVITE-LUTEIN) CAPS capsule Take 1 capsule by mouth every evening.     Omega-3 Fatty Acids (FISH OIL) 1000 MG CAPS Take 3 capsules 2 (two) times daily by mouth.     ondansetron (ZOFRAN) 4 MG tablet Take 1 tablet (4 mg total) by mouth every 6 (six) hours as needed for nausea. 20 tablet 0   pantoprazole (PROTONIX) 40 MG tablet Take 40 mg daily by mouth.     propranolol (INDERAL) 10 MG tablet Take 1-4 tabs po BID prn tremor 200 tablet 1   RYBELSUS 3 MG TABS Take 1 tablet by mouth every morning.     Turmeric 500 MG CAPS Take 1,000 mg by mouth in  the morning.     witch hazel-glycerin (TUCKS) pad Apply topically as needed for itching. 40 each 12   TESTOSTERONE UNDECANOATE PO Take 2 capsules by mouth daily. (Patient not taking: Reported on 05/16/2023)     No current facility-administered medications for this visit.  Medication Side Effects: None  Allergies:  Allergies  Allergen Reactions   Codeine Swelling    Face numbness    Fenofibrate Swelling    Legs turn red and swell   Ibuprofen     Stage 4 Kidney failure. Cannot take.   Procardia [Nifedipine] Swelling   Sulfa Antibiotics Swelling    History reviewed. No pertinent past medical history.  History reviewed. No pertinent family history.  Social History   Socioeconomic History   Marital status: Married    Spouse name: Not on file   Number of children: Not on file   Years of education: Not on file   Highest education level: Not on file  Occupational History   Not on file  Tobacco Use   Smoking status: Never   Smokeless tobacco: Never  Substance and Sexual Activity   Alcohol use: Not on file   Drug use: Not on file   Sexual activity: Not on file  Other Topics Concern   Not on file  Social History Narrative   Not on file   Social Determinants of Health   Financial Resource Strain: Not on file  Food Insecurity: Not on file  Transportation Needs: Not on file  Physical Activity: Not on file  Stress: Not on file  Social Connections: Not on file  Intimate Partner Violence: Not on file    Past Medical History, Surgical history, Social history, and Family history were reviewed and updated as appropriate.   Please see review of systems for further details on the patient's review from today.   Objective:   Physical Exam:  There were no vitals taken for this visit.  Physical Exam Constitutional:      General: He is not in acute distress.    Appearance: He is well-developed. He is obese.  Musculoskeletal:        General: No deformity.  Neurological:      Mental Status: He is alert and oriented to person, place, and time.     Coordination: Coordination normal.  Psychiatric:        Attention and Perception: Attention and perception normal. He does not perceive auditory or visual hallucinations.        Mood and Affect: Mood normal. Mood is not anxious or depressed. Affect is not labile, blunt, angry, tearful or inappropriate.        Speech: Speech normal.        Behavior: Behavior normal.        Thought Content: Thought content normal. Thought content is not delusional. Thought content does not include homicidal or suicidal ideation. Thought content does not include suicidal plan.        Cognition and Memory: Cognition and memory normal.        Judgment: Judgment normal.     Comments: Insight intact. No delusions.  Irritable.     Lab Review:     Component Value Date/Time   NA 141 01/07/2021 0552   K 4.4 01/07/2021 0552   CL 112 (H) 01/07/2021 0552   CO2 20 (L) 01/07/2021 0552   GLUCOSE 127 (H) 01/07/2021 0552   BUN 33 (H) 01/07/2021 0552   CREATININE 2.14 (H) 01/07/2021 0552   CALCIUM 9.1 01/07/2021 0552   PROT 7.6 01/03/2021 1149   ALBUMIN 3.5 01/03/2021 1149   AST 30 01/03/2021 1149   ALT 33 01/03/2021 1149   ALKPHOS 64 01/03/2021 1149   BILITOT 0.7 01/03/2021 1149   GFRNONAA 35 (L) 01/07/2021 1610  Component Value Date/Time   WBC 7.6 01/07/2021 0552   RBC 3.69 (L) 01/07/2021 0552   HGB 11.9 (L) 01/07/2021 0552   HCT 36.9 (L) 01/07/2021 0552   PLT 217 01/07/2021 0552   MCV 100.0 01/07/2021 0552   MCH 32.2 01/07/2021 0552   MCHC 32.2 01/07/2021 0552   RDW 12.8 01/07/2021 0552   LYMPHSABS 2.9 01/07/2021 0552   MONOABS 0.5 01/07/2021 0552   EOSABS 0.1 01/07/2021 0552   BASOSABS 0.0 01/07/2021 0552    No results found for: "POCLITH", "LITHIUM"   Lab Results  Component Value Date   VALPROATE 88 05/23/2023     .res Assessment: Plan:    Adam "Scharlene Corn" was seen today for follow-up, depression and  anxiety.  Diagnoses and all orders for this visit:  Bipolar I disorder (HCC)  Benign essential tremor -     propranolol (INDERAL) 10 MG tablet; Take 1-4 tabs po BID prn tremor  Other orders -     Acetylcysteine (NAC) 600 MG CAPS; Take 2 capsules (1,200 mg total) by mouth every morning.    supportive therapy for longterm stability.  Aware of high relapse risk off the meds.  He agrees to continue. Disc concerns about longterm lithium in the past contributing to his CKD.  Good stability and good response wituout SE.  Continue Depakote ER 500 mg in the AM and 1500 mg at night. Check VPA level and ammonia level bc tremor and lethargy family notices. Lab Results  Component Value Date   VALPROATE 88 05/23/2023  05/23/23 ammonia low at 38  Disc tremors and caffeine and propranolol.   Consider addtion for irritability.  He's open to consider but wants to wait for adjustment to new DM meds.    Constipation management 1.  Lots of water 2.  Powdered fiber supplement such as MiraLAX, Citrucel, etc. preferably with a meal 3.  2 stool softeners a day 4.  Milk of magnesia or magnesium tablets if needed  labs are normal then RX propranolol for tremor.   Disc risk in DM.   For tremor trial propranolol 1-4  tablets twice daily as needed for tremor  And also if ammonia normal then suggest NAC for cog concerns   (NAC) N-Acetylcysteine 2 of the  600 mg capsules daily to help with mild cognitive problems.  It can be combined with a B-complex vitamin as the B-12 and folate which can sometimes enhance the effect.  He agrees.   FU 2 mos  Meredith Staggers, MD, DFAPA   Please see After Visit Summary for patient specific instructions.  No future appointments.   No orders of the defined types were placed in this encounter.   -------------------------------

## 2023-07-16 NOTE — Patient Instructions (Signed)
(  NAC) N-Acetylcysteine 2 of the  600 mg capsules daily to help with mild cognitive problems.  It can be combined with a B-complex vitamin as the B-12 and folate which can sometimes enhance the effect.  For tremor trial propranolol 1-4  tablets twice daily as needed for tremor

## 2023-08-13 ENCOUNTER — Other Ambulatory Visit: Payer: Self-pay | Admitting: Psychiatry

## 2023-08-13 DIAGNOSIS — F319 Bipolar disorder, unspecified: Secondary | ICD-10-CM

## 2023-08-13 NOTE — Telephone Encounter (Signed)
LV 11/19 NOTE SAYS Reduced Depakote to 1500 mg daily and W thinks he's sleeping more. Not more irritable as far as he notices.

## 2023-10-09 ENCOUNTER — Other Ambulatory Visit: Payer: Self-pay | Admitting: Psychiatry

## 2023-10-09 DIAGNOSIS — G250 Essential tremor: Secondary | ICD-10-CM

## 2023-10-10 ENCOUNTER — Other Ambulatory Visit: Payer: Self-pay | Admitting: Psychiatry

## 2023-10-10 DIAGNOSIS — F319 Bipolar disorder, unspecified: Secondary | ICD-10-CM

## 2023-10-16 ENCOUNTER — Ambulatory Visit (INDEPENDENT_AMBULATORY_CARE_PROVIDER_SITE_OTHER): Payer: 59 | Admitting: Psychiatry

## 2023-10-29 ENCOUNTER — Ambulatory Visit (INDEPENDENT_AMBULATORY_CARE_PROVIDER_SITE_OTHER): Payer: 59 | Admitting: Psychiatry

## 2023-10-29 ENCOUNTER — Encounter: Payer: Self-pay | Admitting: Psychiatry

## 2023-10-29 DIAGNOSIS — G250 Essential tremor: Secondary | ICD-10-CM

## 2023-10-29 DIAGNOSIS — F319 Bipolar disorder, unspecified: Secondary | ICD-10-CM | POA: Diagnosis not present

## 2023-10-29 MED ORDER — ARIPIPRAZOLE 5 MG PO TABS: 5.0000 mg | ORAL_TABLET | Freq: Every day | ORAL | 1 refills | Status: DC

## 2023-10-29 MED ORDER — PROPRANOLOL HCL 40 MG PO TABS: 40.0000 mg | ORAL_TABLET | Freq: Two times a day (BID) | ORAL | 0 refills | Status: DC | PRN

## 2023-10-29 NOTE — Progress Notes (Signed)
 Adam Bowman [id number] [birth year] 58 y.o.  Subjective:   Patient ID:  Adam Bowman is a 58 y.o. (DOB [birth year]) male.  Chief Complaint:  Chief Complaint  Patient presents with   Follow-up    Mood, meds , SE    HPI Adam Bowman presents to the office today for follow-up of bipolar disorder.  seen 04/2019 & 04/2020 without med changes.  05/16/21 appt noted: No unusual mood swings.  No SE issues. Consistent with meds. No health problems.  Ongoing chronic family stressors.  Caring for M with dementia.   Usually sleep OK. No med changes  05/14/22 appt noted: Psychiatric meds include Depakote ER 500 mg in the morning and 1500 mg nightly.  No other psychiatric meds. Lowsy physically.  Stage 4 kidney failure and had to change some other meds. Rebelsis constipation and nausea.   Lost from 227 to 211# since June.   Irritable and thinks physically feeling bad causes some of this. Supervises care of M with dementia.  D's do most of the care.  She is less verbal and fusses at people that aren't there. Doing fine with mood. No swings.  No illnesses. Remains stable on 2000 mg Depakote daily. No problems with the Depakote.  Wants to have daughters switch to Depakote from lithium bc he's in stage 4 CKD.   Patient reports stable mood and denies depressed.  Less patient than I could be.  Patient denies any recent difficulty with anxiety.  Patient denies difficulty with sleep initiation or maintenance except for physical sx. Denies appetite disturbance.  Patient reports that energy and motivation have been good.  Patient denies any difficulty with concentration.  Patient denies any suicidal ideation. No SE  05/16/2023: Appointment noted: Of note is children complain that he is lethargic, inactive and more forgetful lately.  He has been more dependent on his daughters than in the past. Off testosterone bc didn't think it was helpful.  No different off it for a couple of mos. Plugging along.   Doesn't  follow glucose.  Lost 30#.  Was having N on Rebelsius.   Took Ozempic then Thayer now.   No sig change in conc, memory noticed lately.  Can word search in conversation.   When taken B caps had better energy, but stopped DT costs.  Plan: Continue Depakote ER 500 mg in the AM and 1500 mg at night. Check VPA level and ammonia level bc tremor and lethargy family notices.  07/16/23 appt noted:  Reduced Depakote to 1500 mg daily and W thinks he's sleeping more.  Not more irritable as far as he notices.     Still gets shakes at times and asks about trying propranolol for intermittent tremor.  No difference in tremor with reduction in VPA.   Tremor affects writing.      10/29/23 appt noted: Med: VPA 500 mg AM &1500 HS, propranool 10-40- BID prn tremor, taking 40 AM Tremor better with propranolol.    Lasts about 8 hours. No SE Mood pretty good overall but times of less patient and more snappy.   Still losing weight on Mounjaro.  Reduced appetite.   Past Psychiatric Medication Trials:  Lithium, Depakote 2000,  perphenazine EPS, Wellbutrin Abilify  GF and F had tremor and F had DBS for tremor  Review of Systems:  Review of Systems  Endocrine: Negative for polyuria.  Genitourinary:        Nocturia  Musculoskeletal:  Positive for arthralgias and myalgias.  Neurological:  Negative for tremors and weakness.  Psychiatric/Behavioral:  Positive for dysphoric mood.    F and GF had tremor and F had brain implants.    Medications: I have reviewed the patient's current medications.  Current Outpatient Medications  Medication Sig Dispense Refill   acetaminophen (TYLENOL) 325 MG tablet Take 2 tablets (650 mg total) by mouth every 6 (six) hours as needed for mild pain (or Fever >/= 101).     Acetylcysteine (NAC) 600 MG CAPS Take 2 capsules (1,200 mg total) by mouth every morning. 60 capsule 2   ARIPiprazole (ABILIFY) 5 MG tablet Take 1 tablet (5 mg total) by mouth daily. 30 tablet 1   Ascorbic  Acid (VITAMIN C) 500 MG CHEW Chew 4,000 mg by mouth in the morning and at bedtime. Take 8 tablets BID     aspirin EC 81 MG tablet Take 81 mg daily by mouth.     atorvastatin (LIPITOR) 20 MG tablet Take 20 mg daily by mouth.     b complex vitamins tablet Take 1 tablet by mouth at bedtime.     BEE POLLEN PO Take 1 capsule by mouth in the morning and at bedtime.     Cholecalciferol (VITAMIN D3) 2000 units TABS Take 4,000 Units by mouth at bedtime.     CINNAMON PO Take 2 capsules by mouth in the morning and at bedtime.     Coenzyme Q10 (COQ-10 PO) Take 1 capsule by mouth daily.     colchicine 0.6 MG tablet Take 0.6 mg by mouth daily.     Cyanocobalamin (BL VITAMIN B-12 PO) Take 1 tablet by mouth daily.     divalproex (DEPAKOTE ER) 500 MG 24 hr tablet take 1 tablet by mouth in the morning and 3 tablets at bedtime 120 tablet 0   febuxostat (ULORIC) 40 MG tablet Take 40 mg by mouth daily.     Fexofenadine HCl (ALLERGY 24-HR PO) Take 1 tablet by mouth daily.     fluticasone (FLONASE) 50 MCG/ACT nasal spray Place 1 spray into both nostrils daily.     furosemide (LASIX) 20 MG tablet Take 20 mg by mouth daily.     Green Tea 250 MG CAPS Take 1 capsule by mouth 2 (two) times daily.     GuaiFENesin (MUCINEX PO) Take 1 tablet by mouth daily.     JARDIANCE 25 MG TABS tablet Take 25 mg by mouth daily.     KRILL OIL PO Take 1 capsule by mouth 2 (two) times daily.     losartan (COZAAR) 50 MG tablet Take 50 mg daily by mouth.     Misc Natural Products (JOINT SUPPORT COMPLEX PO) Take 1 capsule by mouth in the morning.     Misc Natural Products (PROSTATE) CAPS Take 1 capsule by mouth in the morning.     Multiple Vitamin (MULTIVITAMIN WITH MINERALS) TABS tablet Take 1 tablet daily by mouth.     multivitamin-lutein (OCUVITE-LUTEIN) CAPS capsule Take 1 capsule by mouth every evening.     Omega-3 Fatty Acids (FISH OIL) 1000 MG CAPS Take 3 capsules 2 (two) times daily by mouth.     ondansetron (ZOFRAN) 4 MG tablet  Take 1 tablet (4 mg total) by mouth every 6 (six) hours as needed for nausea. 20 tablet 0   pantoprazole (PROTONIX) 40 MG tablet Take 40 mg daily by mouth.     RYBELSUS 3 MG TABS Take 1 tablet by mouth every morning.     tirzepatide [facility]) 5 MG/0.5ML Pen Inject  5 mg into the skin once a week.     Turmeric 500 MG CAPS Take 1,000 mg by mouth in the morning.     witch hazel-glycerin (TUCKS) pad Apply topically as needed for itching. 40 each 12   propranolol (INDERAL) 40 MG tablet Take 1 tablet (40 mg total) by mouth 2 (two) times daily as needed (tremor). 90 tablet 0   No current facility-administered medications for this visit.    Medication Side Effects: None  Allergies:  Allergies  Allergen Reactions   Codeine Swelling    Face numbness    Fenofibrate Swelling    Legs turn red and swell   Ibuprofen     Stage 4 Kidney failure. Cannot take.   Procardia [Nifedipine] Swelling   Sulfa Antibiotics Swelling    History reviewed. No pertinent past medical history.  History reviewed. No pertinent family history.  Social History   Socioeconomic History   Marital status: Married    Spouse name: Not on file   Number of children: Not on file   Years of education: Not on file   Highest education level: Not on file  Occupational History   Not on file  Tobacco Use   Smoking status: Never   Smokeless tobacco: Never  Substance and Sexual Activity   Alcohol use: Not on file   Drug use: Not on file   Sexual activity: Not on file  Other Topics Concern   Not on file  Social History Narrative   Not on file   Social Drivers of Health   Financial Resource Strain: Not on file  Food Insecurity: Not on file  Transportation Needs: Not on file  Physical Activity: Not on file  Stress: Not on file  Social Connections: Not on file  Intimate Partner Violence: Not on file    Past Medical History, Surgical history, Social history, and Family history were reviewed and updated as  appropriate.   Please see review of systems for further details on the patient's review from today.   Objective:   Physical Exam:  There were no vitals taken for this visit.  Physical Exam Constitutional:      General: He is not in acute distress.    Appearance: He is well-developed. He is obese.  Musculoskeletal:        General: No deformity.  Neurological:     Mental Status: He is alert and oriented to person, place, and time.     Coordination: Coordination normal.  Psychiatric:        Attention and Perception: Attention and perception normal. He does not perceive auditory or visual hallucinations.        Mood and Affect: Mood normal. Mood is not anxious or depressed. Affect is not labile, blunt, angry, tearful or inappropriate.        Speech: Speech normal.        Behavior: Behavior normal.        Thought Content: Thought content normal. Thought content is not delusional. Thought content does not include homicidal or suicidal ideation. Thought content does not include suicidal plan.        Cognition and Memory: Cognition and memory normal.        Judgment: Judgment normal.     Comments: Insight intact. No delusions.  Irritable ongoing     Lab Review:     Component Value Date/Time   NA 141 01/07/2021 0552   K 4.4 01/07/2021 0552   CL 112 (H) 01/07/2021 0552   CO2  20 (L) 01/07/2021 0552   GLUCOSE 127 (H) 01/07/2021 0552   BUN 33 (H) 01/07/2021 0552   CREATININE 2.14 (H) 01/07/2021 0552   CALCIUM 9.1 01/07/2021 0552   PROT 7.6 01/03/2021 1149   ALBUMIN 3.5 01/03/2021 1149   AST 30 01/03/2021 1149   ALT 33 01/03/2021 1149   ALKPHOS 64 01/03/2021 1149   BILITOT 0.7 01/03/2021 1149   GFRNONAA 35 (L) 01/07/2021 0552       Component Value Date/Time   WBC 7.6 01/07/2021 0552   RBC 3.69 (L) 01/07/2021 0552   HGB 11.9 (L) 01/07/2021 0552   HCT 36.9 (L) 01/07/2021 0552   PLT 217 01/07/2021 0552   MCV 100.0 01/07/2021 0552   MCH 32.2 01/07/2021 0552   MCHC 32.2  01/07/2021 0552   RDW 12.8 01/07/2021 0552   LYMPHSABS 2.9 01/07/2021 0552   MONOABS 0.5 01/07/2021 0552   EOSABS 0.1 01/07/2021 0552   BASOSABS 0.0 01/07/2021 0552    No results found for: "POCLITH", "LITHIUM"   Lab Results  Component Value Date   VALPROATE 88 05/23/2023    Checked VPA level and ammonia level bc tremor and lethargy family notices. Lab Results  Component Value Date   VALPROATE 88 05/23/2023  05/23/23 ammonia low at 38 .res Assessment: Plan:    Sherril "Scharlene Corn" was seen today for follow-up.  Diagnoses and all orders for this visit:  Bipolar I disorder (HCC) -     ARIPiprazole (ABILIFY) 5 MG tablet; Take 1 tablet (5 mg total) by mouth daily.  Benign essential tremor -     propranolol (INDERAL) 40 MG tablet; Take 1 tablet (40 mg total) by mouth 2 (two) times daily as needed (tremor).   supportive therapy for longterm stability.  Aware of high relapse risk off the meds.  He agrees to continue. Disc concerns about longterm lithium in the past contributing to his CKD.  Good stability and good response wituout SE.  Continue Depakote ER 500 mg in the AM and 1500 mg at night.  Disc tremors and caffeine and propranolol.   Consider addtion for irritability.  He's open to consider .  Call if ineffectiver at  2 weeks. Abilify 5 mg daily.  Constipation management 1.  Lots of water 2.  Powdered fiber supplement such as MiraLAX, Citrucel, etc. preferably with a meal 3.  2 stool softeners a day 4.  Milk of magnesia or magnesium tablets if needed  labs are normal then RX propranolol for tremor.   Disc risk in DM.   For tremor trial propranolol 1-4  tablets twice daily as needed for tremor  And also if ammonia normal then suggest NAC for cog concerns   (NAC) N-Acetylcysteine 2 of the  600 mg capsules daily to help with mild cognitive problems.  It can be combined with a B-complex vitamin as the B-12 and folate which can sometimes enhance the effect.  He agrees.    FU 2 mos  Meredith Staggers, MD, DFAPA   Please see After Visit Summary for patient specific instructions.  Future Appointments  Date Time Provider Department Center  12/26/2023 10:00 AM Cottle, Steva Ready., MD [facility] None     No orders of the defined types were placed in this encounter.   -------------------------------

## 2023-11-28 ENCOUNTER — Other Ambulatory Visit: Payer: Self-pay | Admitting: Psychiatry

## 2023-11-28 ENCOUNTER — Telehealth: Payer: Self-pay | Admitting: Psychiatry

## 2023-11-28 DIAGNOSIS — F319 Bipolar disorder, unspecified: Secondary | ICD-10-CM

## 2023-11-28 MED ORDER — ARIPIPRAZOLE 10 MG PO TABS: 10.0000 mg | ORAL_TABLET | Freq: Every day | ORAL | 0 refills | Status: DC

## 2023-11-28 NOTE — Telephone Encounter (Signed)
 Agreed.  Sent 10 mg Abilify to [facility].  Let him know he can double up  on the 5 mg if he has them left and then pick up 10 mg RX

## 2023-11-28 NOTE — Telephone Encounter (Signed)
 Pt called at 11:52a.  He said dr Jennelle Human told him to call back to have Aripiprazole increased if it wasn't doing what he wanted it to do.  He said he is on the lowest dose and he would like to increase it.  Pls send to   [facility] [address] [place] - [address] [place] [address] Phone: [phone]  Fax: [phone]   Next appt 5/1

## 2023-11-28 NOTE — Telephone Encounter (Signed)
 Pt seen on 3/4 Consider addtion for irritability.  He's open to consider .  Call if ineffectiver at  2 weeks. Abilify 5 mg daily.  He is reporting some improvement in his irritability, but feels like a higher dose would be beneficial. He reports no other concerns. No reported SE. Sleeping well.

## 2023-11-28 NOTE — Telephone Encounter (Signed)
 Patient notified of new Rx at [facility] for 10 mg Abilify and recommendations.

## 2023-12-03 ENCOUNTER — Other Ambulatory Visit: Payer: Self-pay | Admitting: Psychiatry

## 2023-12-03 DIAGNOSIS — F319 Bipolar disorder, unspecified: Secondary | ICD-10-CM

## 2023-12-13 ENCOUNTER — Other Ambulatory Visit: Payer: Self-pay | Admitting: Psychiatry

## 2023-12-13 DIAGNOSIS — G250 Essential tremor: Secondary | ICD-10-CM

## 2023-12-26 ENCOUNTER — Ambulatory Visit: Payer: 59 | Admitting: Psychiatry

## 2023-12-26 ENCOUNTER — Encounter: Payer: Self-pay | Admitting: Psychiatry

## 2023-12-26 DIAGNOSIS — F319 Bipolar disorder, unspecified: Secondary | ICD-10-CM | POA: Diagnosis not present

## 2023-12-26 DIAGNOSIS — G250 Essential tremor: Secondary | ICD-10-CM

## 2023-12-26 MED ORDER — DIVALPROEX SODIUM ER 500 MG PO TB24: ORAL_TABLET | ORAL | 1 refills | Status: DC

## 2023-12-26 MED ORDER — ARIPIPRAZOLE 10 MG PO TABS: 10.0000 mg | ORAL_TABLET | Freq: Every day | ORAL | 1 refills | Status: DC

## 2023-12-26 NOTE — Progress Notes (Signed)
 Adam Bowman [id number] 12-31-1965 58 y.o.  Subjective:   Patient ID:  Adam Bowman is a 58 y.o. (DOB [birth year]) male.  Chief Complaint:  Chief Complaint  Patient presents with   Follow-up    Bipolar and med change    HPI Adam Bowman presents to the office today for follow-up of bipolar disorder.  seen 04/2019 & 04/2020 without med changes.  05/16/21 appt noted: No unusual mood swings.  No SE issues. Consistent with meds. No health problems.  Ongoing chronic family stressors.  Caring for M with dementia.   Usually sleep OK. No med changes  05/14/22 appt noted: Psychiatric meds include Depakote  ER 500 mg in the morning and 1500 mg nightly.  No other psychiatric meds. Lowsy physically.  Stage 4 kidney failure and had to change some other meds. Rebelsis constipation and nausea.   Lost from 227 to 211# since June.   Irritable and thinks physically feeling bad causes some of this. Supervises care of M with dementia.  D's do most of the care.  She is less verbal and fusses at people that aren't there. Doing fine with mood. No swings.  No illnesses. Remains stable on 2000 mg Depakote  daily. No problems with the Depakote .  Wants to have daughters switch to Depakote  from lithium bc he's in stage 4 CKD.   Patient reports stable mood and denies depressed.  Less patient than I could be.  Patient denies any recent difficulty with anxiety.  Patient denies difficulty with sleep initiation or maintenance except for physical sx. Denies appetite disturbance.  Patient reports that energy and motivation have been good.  Patient denies any difficulty with concentration.  Patient denies any suicidal ideation. No SE  05/16/2023: Appointment noted: Of note is children complain that he is lethargic, inactive and more forgetful lately.  He has been more dependent on his daughters than in the past. Off testosterone bc didn't think it was helpful.  No different off it for a couple of mos. Plugging along.    Doesn't follow glucose.  Lost 30#.  Was having N on Rebelsius.   Took Ozempic then Mounjaro now.   No sig change in conc, memory noticed lately.  Can word search in conversation.   When taken B caps had better energy, but stopped DT costs.  Plan: Continue Depakote  ER 500 mg in the AM and 1500 mg at night. Check VPA level and ammonia level bc tremor and lethargy family notices.  07/16/23 appt noted:  Reduced Depakote  to 1500 mg daily and W thinks he's sleeping more.  Not more irritable as far as he notices.     Still gets shakes at times and asks about trying propranolol  for intermittent tremor.  No difference in tremor with reduction in VPA.   Tremor affects writing.      10/29/23 appt noted: Med: reduced VPA 500 mg AM &1500 HS, propranool 10-40- BID prn tremor, taking 40 AM Tremor better with propranolol .    Lasts about 8 hours. No SE Mood pretty good overall but times of less patient and more snappy.   Still losing weight on Mounjaro.  Reduced appetite. Plan: add Abilify  5 for irritablity  05/28/23 ammonia normal   12/26/23 appt noted: Med: Abilify  10, VPA 500 mg AM &1500 HS, propranool 10-40- BID prn tremor, taking 40 AM Abilify  helped irritability much better.  Not as snappy.   Feel so much better. No strong feelings about trying to wean VPA and use Abilify  solo;  understanding risks of doing so. Patient reports stable mood and denies depressed or irritable moods.  Patient denies any recent difficulty with anxiety.  Patient denies difficulty with sleep initiation or maintenance. Denies appetite disturbance.  Patient reports that energy and motivation have been good.  Patient denies any difficulty with concentration.  Patient denies any suicidal ideation. No other SE .  Past Psychiatric Medication Trials:  Lithium, Depakote  2000,  perphenazine EPS, Wellbutrin Abilify   GF and F had tremor and F had DBS for tremor  Review of Systems:  Review of Systems  Endocrine: Negative for  polyuria.  Genitourinary:        Nocturia  Musculoskeletal:  Positive for arthralgias and myalgias.  Neurological:  Negative for tremors and weakness.  Psychiatric/Behavioral:  Positive for dysphoric mood.    F and GF had tremor and F had brain implants.    Medications: I have reviewed the patient's current medications.  Current Outpatient Medications  Medication Sig Dispense Refill   acetaminophen  (TYLENOL ) 325 MG tablet Take 2 tablets (650 mg total) by mouth every 6 (six) hours as needed for mild pain (or Fever >/= 101).     Acetylcysteine (NAC) 600 MG CAPS Take 2 capsules (1,200 mg total) by mouth every morning. 60 capsule 2   Ascorbic Acid (VITAMIN C) 500 MG CHEW Chew 4,000 mg by mouth in the morning and at bedtime. Take 8 tablets BID     aspirin  EC 81 MG tablet Take 81 mg daily by mouth.     atorvastatin  (LIPITOR) 20 MG tablet Take 20 mg daily by mouth.     b complex vitamins tablet Take 1 tablet by mouth at bedtime.     BEE POLLEN PO Take 1 capsule by mouth in the morning and at bedtime.     Cholecalciferol (VITAMIN D3) 2000 units TABS Take 4,000 Units by mouth at bedtime.     CINNAMON PO Take 2 capsules by mouth in the morning and at bedtime.     Coenzyme Q10 (COQ-10 PO) Take 1 capsule by mouth daily.     colchicine 0.6 MG tablet Take 0.6 mg by mouth daily.     Cyanocobalamin (BL VITAMIN B-12 PO) Take 1 tablet by mouth daily.     febuxostat (ULORIC) 40 MG tablet Take 40 mg by mouth daily.     Fexofenadine  HCl (ALLERGY 24-HR PO) Take 1 tablet by mouth daily.     fluticasone  (FLONASE ) 50 MCG/ACT nasal spray Place 1 spray into both nostrils daily.     furosemide  (LASIX ) 20 MG tablet Take 20 mg by mouth daily.     Green Tea 250 MG CAPS Take 1 capsule by mouth 2 (two) times daily.     GuaiFENesin (MUCINEX PO) Take 1 tablet by mouth daily.     JARDIANCE  25 MG TABS tablet Take 25 mg by mouth daily.     KRILL OIL PO Take 1 capsule by mouth 2 (two) times daily.     losartan  (COZAAR ) 50  MG tablet Take 50 mg daily by mouth.     Misc Natural Products (JOINT SUPPORT COMPLEX PO) Take 1 capsule by mouth in the morning.     Misc Natural Products (PROSTATE) CAPS Take 1 capsule by mouth in the morning.     Multiple Vitamin (MULTIVITAMIN WITH MINERALS) TABS tablet Take 1 tablet daily by mouth.     multivitamin-lutein (OCUVITE-LUTEIN) CAPS capsule Take 1 capsule by mouth every evening.     Omega-3 Fatty Acids (FISH OIL) 1000  MG CAPS Take 3 capsules 2 (two) times daily by mouth.     ondansetron  (ZOFRAN ) 4 MG tablet Take 1 tablet (4 mg total) by mouth every 6 (six) hours as needed for nausea. 20 tablet 0   pantoprazole  (PROTONIX ) 40 MG tablet Take 40 mg daily by mouth.     propranolol  (INDERAL ) 40 MG tablet Take 1 tablet (40 mg total) by mouth 2 (two) times daily as needed (tremor). 60 tablet 0   RYBELSUS 3 MG TABS Take 1 tablet by mouth every morning.     tirzepatide (MOUNJARO) 5 MG/0.5ML Pen Inject 5 mg into the skin once a week.     Turmeric 500 MG CAPS Take 1,000 mg by mouth in the morning.     witch hazel-glycerin  (TUCKS) pad Apply topically as needed for itching. 40 each 12   ARIPiprazole  (ABILIFY ) 10 MG tablet Take 1 tablet (10 mg total) by mouth daily. 90 tablet 1   divalproex  (DEPAKOTE  ER) 500 MG 24 hr tablet TAKE ONE TABLET BY MOUTH DAILY IN THE MORNING AND TAKE THREE TABLETS AT BEDTIME 360 tablet 1   No current facility-administered medications for this visit.    Medication Side Effects: None  Allergies:  Allergies  Allergen Reactions   Codeine Swelling    Face numbness    Fenofibrate Swelling    Legs turn red and swell   Ibuprofen     Stage 4 Kidney failure. Cannot take.   Procardia [Nifedipine] Swelling   Sulfa Antibiotics Swelling    History reviewed. No pertinent past medical history.  History reviewed. No pertinent family history.  Social History   Socioeconomic History   Marital status: Married    Spouse name: Not on file   Number of children: Not on  file   Years of education: Not on file   Highest education level: Not on file  Occupational History   Not on file  Tobacco Use   Smoking status: Never   Smokeless tobacco: Never  Substance and Sexual Activity   Alcohol use: Not on file   Drug use: Not on file   Sexual activity: Not on file  Other Topics Concern   Not on file  Social History Narrative   Not on file   Social Drivers of Health   Financial Resource Strain: Not on file  Food Insecurity: Not on file  Transportation Needs: Not on file  Physical Activity: Not on file  Stress: Not on file  Social Connections: Not on file  Intimate Partner Violence: Not on file    Past Medical History, Surgical history, Social history, and Family history were reviewed and updated as appropriate.   Please see review of systems for further details on the patient's review from today.   Objective:   Physical Exam:  There were no vitals taken for this visit.  Physical Exam Constitutional:      General: He is not in acute distress.    Appearance: He is well-developed. He is obese.  Musculoskeletal:        General: No deformity.  Neurological:     Mental Status: He is alert and oriented to person, place, and time.     Coordination: Coordination normal.  Psychiatric:        Attention and Perception: Attention and perception normal. He does not perceive auditory or visual hallucinations.        Mood and Affect: Mood normal. Mood is not anxious or depressed. Affect is not labile, blunt, angry, tearful or inappropriate.  Speech: Speech normal.        Behavior: Behavior normal.        Thought Content: Thought content normal. Thought content is not delusional. Thought content does not include homicidal or suicidal ideation. Thought content does not include suicidal plan.        Cognition and Memory: Cognition and memory normal.        Judgment: Judgment normal.     Comments: Insight intact. No delusions.  Irritable resolved  with Abilify  10     Lab Review:     Component Value Date/Time   NA 141 01/07/2021 0552   K 4.4 01/07/2021 0552   CL 112 (H) 01/07/2021 0552   CO2 20 (L) 01/07/2021 0552   GLUCOSE 127 (H) 01/07/2021 0552   BUN 33 (H) 01/07/2021 0552   CREATININE 2.14 (H) 01/07/2021 0552   CALCIUM  9.1 01/07/2021 0552   PROT 7.6 01/03/2021 1149   ALBUMIN 3.5 01/03/2021 1149   AST 30 01/03/2021 1149   ALT 33 01/03/2021 1149   ALKPHOS 64 01/03/2021 1149   BILITOT 0.7 01/03/2021 1149   GFRNONAA 35 (L) 01/07/2021 0552       Component Value Date/Time   WBC 7.6 01/07/2021 0552   RBC 3.69 (L) 01/07/2021 0552   HGB 11.9 (L) 01/07/2021 0552   HCT 36.9 (L) 01/07/2021 0552   PLT 217 01/07/2021 0552   MCV 100.0 01/07/2021 0552   MCH 32.2 01/07/2021 0552   MCHC 32.2 01/07/2021 0552   RDW 12.8 01/07/2021 0552   LYMPHSABS 2.9 01/07/2021 0552   MONOABS 0.5 01/07/2021 0552   EOSABS 0.1 01/07/2021 0552   BASOSABS 0.0 01/07/2021 0552    No results found for: "POCLITH", "LITHIUM"   Lab Results  Component Value Date   VALPROATE 88 05/23/2023    Checked VPA level and ammonia level bc tremor and lethargy family notices. Lab Results  Component Value Date   VALPROATE 88 05/23/2023  05/23/23 ammonia low at 38 .res Assessment: Plan:    Brentlee "Lyn Sanders" was seen today for follow-up.  Diagnoses and all orders for this visit:  Bipolar I disorder (HCC) -     ARIPiprazole  (ABILIFY ) 10 MG tablet; Take 1 tablet (10 mg total) by mouth daily. -     divalproex  (DEPAKOTE  ER) 500 MG 24 hr tablet; TAKE ONE TABLET BY MOUTH DAILY IN THE MORNING AND TAKE THREE TABLETS AT BEDTIME  Benign essential tremor   30 min:  supportive therapy for longterm stability.  Aware of high relapse risk off the meds.  He agrees to continue. Disc concerns about longterm lithium in the past contributing to his CKD.  Good stability and good response wituout SE.  Continue Depakote  ER 500 mg in the AM and 1500 mg at night.  Disc  tremors and caffeine and propranolol .   Irritable resolved with Abilify  10  Constipation management 1.  Lots of water 2.  Powdered fiber supplement such as MiraLAX, Citrucel, etc. preferably with a meal 3.  2 stool softeners a day 4.  Milk of magnesia or magnesium tablets if needed  labs are normal then RX propranolol  for tremor.   Disc risk in DM.   For tremor continue propranolol  1-4  tablets twice daily as needed for tremor  And also if ammonia normal then suggest NAC for cog concerns   (NAC) N-Acetylcysteine 2 of the  600 mg capsules daily to help with mild cognitive problems.  It can be combined with a B-complex vitamin as the B-12  and folate which can sometimes enhance the effect.  No other med changes  He agrees.   FU 2 mos  Nori Beat, MD, DFAPA   Please see After Visit Summary for patient specific instructions.  No future appointments.    No orders of the defined types were placed in this encounter.   -------------------------------

## 2024-01-29 ENCOUNTER — Other Ambulatory Visit: Payer: Self-pay | Admitting: Psychiatry

## 2024-01-29 DIAGNOSIS — G250 Essential tremor: Secondary | ICD-10-CM

## 2024-04-10 ENCOUNTER — Other Ambulatory Visit: Payer: Self-pay | Admitting: Psychiatry

## 2024-04-10 DIAGNOSIS — F319 Bipolar disorder, unspecified: Secondary | ICD-10-CM

## 2024-04-29 ENCOUNTER — Ambulatory Visit (INDEPENDENT_AMBULATORY_CARE_PROVIDER_SITE_OTHER): Admitting: Psychiatry

## 2024-04-29 ENCOUNTER — Encounter: Payer: Self-pay | Admitting: Psychiatry

## 2024-04-29 DIAGNOSIS — F319 Bipolar disorder, unspecified: Secondary | ICD-10-CM

## 2024-04-29 DIAGNOSIS — G250 Essential tremor: Secondary | ICD-10-CM | POA: Diagnosis not present

## 2024-04-29 MED ORDER — PROPRANOLOL HCL 40 MG PO TABS: 40.0000 mg | ORAL_TABLET | Freq: Two times a day (BID) | ORAL | 2 refills | Status: AC | PRN

## 2024-04-29 MED ORDER — ARIPIPRAZOLE 10 MG PO TABS: 10.0000 mg | ORAL_TABLET | Freq: Every day | ORAL | 1 refills | Status: AC

## 2024-04-29 MED ORDER — DIVALPROEX SODIUM ER 500 MG PO TB24: ORAL_TABLET | ORAL | 1 refills | Status: AC

## 2024-04-29 NOTE — Progress Notes (Signed)
 CUONG MOORMAN [id number] 05-09-1966 58 y.o.  Subjective:   Patient ID:  Adam Bowman is a 58 y.o. (DOB 04-14-1966) male.  Chief Complaint:  Chief Complaint  Patient presents with   Follow-up   Depression   Anxiety   Medication Problem    HPI ROONEY SWAILS presents to the office today for follow-up of bipolar disorder.  seen 04/2019 & 04/2020 without med changes.  05/16/21 appt noted: No unusual mood swings.  No SE issues. Consistent with meds. No health problems.  Ongoing chronic family stressors.  Caring for M with dementia.   Usually sleep OK. No med changes  05/14/22 appt noted: Psychiatric meds include Depakote  ER 500 mg in the morning and 1500 mg nightly.  No other psychiatric meds. Lowsy physically.  Stage 4 kidney failure and had to change some other meds. Rebelsis constipation and nausea.   Lost from 227 to 211# since June.   Irritable and thinks physically feeling bad causes some of this. Supervises care of M with dementia.  D's do most of the care.  She is less verbal and fusses at people that aren't there. Doing fine with mood. No swings.  No illnesses. Remains stable on 2000 mg Depakote  daily. No problems with the Depakote .  Wants to have daughters switch to Depakote  from lithium bc he's in stage 4 CKD.   Patient reports stable mood and denies depressed.  Less patient than I could be.  Patient denies any recent difficulty with anxiety.  Patient denies difficulty with sleep initiation or maintenance except for physical sx. Denies appetite disturbance.  Patient reports that energy and motivation have been good.  Patient denies any difficulty with concentration.  Patient denies any suicidal ideation. No SE  05/16/2023: Appointment noted: Of note is children complain that he is lethargic, inactive and more forgetful lately.  He has been more dependent on his daughters than in the past. Off testosterone bc didn't think it was helpful.  No different off it for a couple of  mos. Plugging along.   Doesn't follow glucose.  Lost 30#.  Was having N on Rebelsius.   Took Ozempic then Mounjaro now.   No sig change in conc, memory noticed lately.  Can word search in conversation.   When taken B caps had better energy, but stopped DT costs.  Plan: Continue Depakote  ER 500 mg in the AM and 1500 mg at night. Check VPA level and ammonia level bc tremor and lethargy family notices.  07/16/23 appt noted:  Reduced Depakote  to 1500 mg daily and W thinks he's sleeping more.  Not more irritable as far as he notices.     Still gets shakes at times and asks about trying propranolol  for intermittent tremor.  No difference in tremor with reduction in VPA.   Tremor affects writing.      10/29/23 appt noted: Med: reduced VPA 500 mg AM &1500 HS, propranool 10-40- BID prn tremor, taking 40 AM Tremor better with propranolol .    Lasts about 8 hours. No SE Mood pretty good overall but times of less patient and more snappy.   Still losing weight on Mounjaro.  Reduced appetite. Plan: add Abilify  5 for irritablity  05/28/23 ammonia normal   12/26/23 appt noted: Med: Abilify  10, VPA 500 mg AM &1500 HS, propranool 10-40- BID prn tremor, taking 40 AM Abilify  helped irritability much better.  Not as snappy.   Feel so much better. No strong feelings about trying to wean VPA and use  Abilify  solo;  understanding risks of doing so. Patient reports stable mood and denies depressed or irritable moods.  Patient denies any recent difficulty with anxiety.  Patient denies difficulty with sleep initiation or maintenance. Denies appetite disturbance.  Patient reports that energy and motivation have been good.  Patient denies any difficulty with concentration.  Patient denies any suicidal ideation. No other SE .  04/29/24 appt noted:  Med: out of Abilify  10, Depakote  500 mg AM and 1500 mg HS, propranolol  40 AM Had episode of weakness lasting seriously 1 day and then gradually better.  Episode happened off  Abilify . Has DM unstable at times.   Overall mood is very good at 10 mg .  Really in control and calm 99% of the time. Propranolol  helps tremor significantly  Past Psychiatric Medication Trials:  Lithium, Depakote  2000,  perphenazine EPS, Wellbutrin Abilify   GF and F had tremor and F had DBS for tremor  Review of Systems:  Review of Systems  Endocrine: Negative for polyuria.  Genitourinary:        Nocturia  Musculoskeletal:  Positive for arthralgias and myalgias.  Neurological:  Negative for tremors and weakness.  Psychiatric/Behavioral:  Positive for dysphoric mood.    F and GF had tremor and F had brain implants.    Medications: I have reviewed the patient's current medications.  Current Outpatient Medications  Medication Sig Dispense Refill   acetaminophen  (TYLENOL ) 325 MG tablet Take 2 tablets (650 mg total) by mouth every 6 (six) hours as needed for mild pain (or Fever >/= 101).     Acetylcysteine (NAC) 600 MG CAPS Take 2 capsules (1,200 mg total) by mouth every morning. 60 capsule 2   Ascorbic Acid (VITAMIN C) 500 MG CHEW Chew 4,000 mg by mouth in the morning and at bedtime. Take 8 tablets BID     aspirin  EC 81 MG tablet Take 81 mg daily by mouth.     atorvastatin  (LIPITOR) 20 MG tablet Take 20 mg daily by mouth.     b complex vitamins tablet Take 1 tablet by mouth at bedtime.     BEE POLLEN PO Take 1 capsule by mouth in the morning and at bedtime.     Cholecalciferol (VITAMIN D3) 2000 units TABS Take 4,000 Units by mouth at bedtime.     CINNAMON PO Take 2 capsules by mouth in the morning and at bedtime.     Coenzyme Q10 (COQ-10 PO) Take 1 capsule by mouth daily.     colchicine 0.6 MG tablet Take 0.6 mg by mouth daily.     Cyanocobalamin (BL VITAMIN B-12 PO) Take 1 tablet by mouth daily.     febuxostat (ULORIC) 40 MG tablet Take 40 mg by mouth daily.     Fexofenadine  HCl (ALLERGY 24-HR PO) Take 1 tablet by mouth daily.     fluticasone  (FLONASE ) 50 MCG/ACT nasal spray  Place 1 spray into both nostrils daily.     furosemide  (LASIX ) 20 MG tablet Take 20 mg by mouth daily.     Green Tea 250 MG CAPS Take 1 capsule by mouth 2 (two) times daily.     GuaiFENesin (MUCINEX PO) Take 1 tablet by mouth daily.     JARDIANCE  25 MG TABS tablet Take 25 mg by mouth daily.     KRILL OIL PO Take 1 capsule by mouth 2 (two) times daily.     losartan  (COZAAR ) 50 MG tablet Take 50 mg daily by mouth.     Misc Natural Products (  JOINT SUPPORT COMPLEX PO) Take 1 capsule by mouth in the morning.     Misc Natural Products (PROSTATE) CAPS Take 1 capsule by mouth in the morning.     Multiple Vitamin (MULTIVITAMIN WITH MINERALS) TABS tablet Take 1 tablet daily by mouth.     multivitamin-lutein (OCUVITE-LUTEIN) CAPS capsule Take 1 capsule by mouth every evening.     Omega-3 Fatty Acids (FISH OIL) 1000 MG CAPS Take 3 capsules 2 (two) times daily by mouth.     ondansetron  (ZOFRAN ) 4 MG tablet Take 1 tablet (4 mg total) by mouth every 6 (six) hours as needed for nausea. 20 tablet 0   pantoprazole  (PROTONIX ) 40 MG tablet Take 40 mg daily by mouth.     RYBELSUS 3 MG TABS Take 1 tablet by mouth every morning.     tirzepatide (MOUNJARO) 5 MG/0.5ML Pen Inject 5 mg into the skin once a week.     Turmeric 500 MG CAPS Take 1,000 mg by mouth in the morning.     witch hazel-glycerin  (TUCKS) pad Apply topically as needed for itching. 40 each 12   ARIPiprazole  (ABILIFY ) 10 MG tablet Take 1 tablet (10 mg total) by mouth daily. 90 tablet 1   divalproex  (DEPAKOTE  ER) 500 MG 24 hr tablet TAKE ONE TABLET BY MOUTH DAILY IN THE MORNING AND TAKE THREE TABLETS AT BEDTIME 360 tablet 1   propranolol  (INDERAL ) 40 MG tablet Take 1 tablet (40 mg total) by mouth 2 (two) times daily as needed (tremor). 180 tablet 2   No current facility-administered medications for this visit.    Medication Side Effects: None  Allergies:  Allergies  Allergen Reactions   Codeine Swelling    Face numbness    Fenofibrate Swelling     Legs turn red and swell   Ibuprofen     Stage 4 Kidney failure. Cannot take.   Procardia [Nifedipine] Swelling   Sulfa Antibiotics Swelling    History reviewed. No pertinent past medical history.  History reviewed. No pertinent family history.  Social History   Socioeconomic History   Marital status: Married    Spouse name: Not on file   Number of children: Not on file   Years of education: Not on file   Highest education level: Not on file  Occupational History   Not on file  Tobacco Use   Smoking status: Never   Smokeless tobacco: Never  Substance and Sexual Activity   Alcohol use: Not on file   Drug use: Not on file   Sexual activity: Not on file  Other Topics Concern   Not on file  Social History Narrative   Not on file   Social Drivers of Health   Financial Resource Strain: Not on file  Food Insecurity: Not on file  Transportation Needs: Not on file  Physical Activity: Not on file  Stress: Not on file  Social Connections: Not on file  Intimate Partner Violence: Not on file    Past Medical History, Surgical history, Social history, and Family history were reviewed and updated as appropriate.   Please see review of systems for further details on the patient's review from today.   Objective:   Physical Exam:  There were no vitals taken for this visit.  Physical Exam Constitutional:      General: He is not in acute distress.    Appearance: He is well-developed. He is obese.  Musculoskeletal:        General: No deformity.  Neurological:  Mental Status: He is alert and oriented to person, place, and time.     Coordination: Coordination normal.  Psychiatric:        Attention and Perception: Attention and perception normal. He does not perceive auditory or visual hallucinations.        Mood and Affect: Mood normal. Mood is not anxious or depressed. Affect is not labile, blunt, angry, tearful or inappropriate.        Speech: Speech normal.         Behavior: Behavior normal.        Thought Content: Thought content normal. Thought content is not delusional. Thought content does not include homicidal or suicidal ideation. Thought content does not include suicidal plan.        Cognition and Memory: Cognition and memory normal.        Judgment: Judgment normal.     Comments: Insight intact. No delusions.  Irritable resolved with Abilify  10     Lab Review:     Component Value Date/Time   NA 141 01/07/2021 0552   K 4.4 01/07/2021 0552   CL 112 (H) 01/07/2021 0552   CO2 20 (L) 01/07/2021 0552   GLUCOSE 127 (H) 01/07/2021 0552   BUN 33 (H) 01/07/2021 0552   CREATININE 2.14 (H) 01/07/2021 0552   CALCIUM  9.1 01/07/2021 0552   PROT 7.6 01/03/2021 1149   ALBUMIN 3.5 01/03/2021 1149   AST 30 01/03/2021 1149   ALT 33 01/03/2021 1149   ALKPHOS 64 01/03/2021 1149   BILITOT 0.7 01/03/2021 1149   GFRNONAA 35 (L) 01/07/2021 0552       Component Value Date/Time   WBC 7.6 01/07/2021 0552   RBC 3.69 (L) 01/07/2021 0552   HGB 11.9 (L) 01/07/2021 0552   HCT 36.9 (L) 01/07/2021 0552   PLT 217 01/07/2021 0552   MCV 100.0 01/07/2021 0552   MCH 32.2 01/07/2021 0552   MCHC 32.2 01/07/2021 0552   RDW 12.8 01/07/2021 0552   LYMPHSABS 2.9 01/07/2021 0552   MONOABS 0.5 01/07/2021 0552   EOSABS 0.1 01/07/2021 0552   BASOSABS 0.0 01/07/2021 0552    No results found for: POCLITH, LITHIUM   Lab Results  Component Value Date   VALPROATE 88 05/23/2023    Checked VPA level and ammonia level bc tremor and lethargy family notices. Lab Results  Component Value Date   VALPROATE 88 05/23/2023  05/23/23 ammonia low at 38 .res Assessment: Plan:    Romain Erion was seen today for follow-up, depression, anxiety and medication problem.  Diagnoses and all orders for this visit:  Bipolar I disorder (HCC) -     ARIPiprazole  (ABILIFY ) 10 MG tablet; Take 1 tablet (10 mg total) by mouth daily. -     divalproex  (DEPAKOTE  ER) 500 MG 24 hr tablet;  TAKE ONE TABLET BY MOUTH DAILY IN THE MORNING AND TAKE THREE TABLETS AT BEDTIME  Benign essential tremor -     propranolol  (INDERAL ) 40 MG tablet; Take 1 tablet (40 mg total) by mouth 2 (two) times daily as needed (tremor).    30 min:  supportive therapy for longterm stability.  Aware of high relapse risk off the meds.  He agrees to continue. Disc concerns about longterm lithium in the past contributing to his CKD.  Good stability and good response wituout SE.  Continue Depakote  ER 500 mg in the AM and 1500 mg at night.  Disc tremors and caffeine and propranolol .   Irritable resolved with Abilify  10  Constipation management  1.  Lots of water 2.  Powdered fiber supplement such as MiraLAX, Citrucel, etc. preferably with a meal 3.  2 stool softeners a day 4.  Milk of magnesia or magnesium tablets if needed  labs are normal then RX propranolol  for tremor.   Disc risk in DM.   For tremor continue propranolol  1-4  tablets twice daily as needed for tremor  And also if ammonia normal then suggest NAC for cog concerns   (NAC) N-Acetylcysteine 2 of the  600 mg capsules daily to help with mild cognitive problems.  It can be combined with a B-complex vitamin as the B-12 and folate which can sometimes enhance the effect.  No other med changes  He agrees.   FU 2 mos  Lorene Macintosh, MD, DFAPA   Please see After Visit Summary for patient specific instructions.  No future appointments.    No orders of the defined types were placed in this encounter.   -------------------------------

## 2024-07-13 ENCOUNTER — Encounter: Payer: Self-pay | Admitting: Neurology

## 2024-08-25 ENCOUNTER — Ambulatory Visit: Admitting: Neurology

## 2024-08-25 NOTE — Progress Notes (Signed)
 "  Assessment/Plan:   Assessment and Plan Assessment & Plan Tremor Patient's tremor is certainly complex.  Likely has longstanding essential tremor, with strong family history of such, and finds propranolol  effective for this.  Discussed with them that Depakote  can also contribute to this type of postural tremor.  However, symptoms have dramatically worsened over the last year, now having rest tremor and leg tremor and difficulty walking.  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  We discussed nature and pathophysiology of this.  I did not tell him to stop the Abilify , but rather to talk to psychiatrist about the medication.   Cognitive impairment, under evaluation Cognitive impairment with short-term memory issues. Possible impact from medications, mood, and other factors. Lifelong learning disability, but patient does have a bachelor's degree - Ordered MRI of the brain. - Scheduled neurocognitive testing with neuropsychologist. - Lab work today including B12, folate, TSH, HIV (had false positive HIV test several years ago that was repeated and was negative) - May follow-up with Camie depending on results of the above.  Subjective:   Discussed the use of AI scribe software for clinical note transcription with the patient, who gave verbal consent to proceed.  History of Present Illness     Adam Bowman was seen today in the movement disorders clinic for neurologic consultation at the request of Okey Carlin Redbird, MD.  The consultation is for the evaluation of tremor.  Medical records made available to me are reviewed, including Dr. Calhoun notes.  Notes from Dr. Geoffry indicate that some tremor predated Abilify  (added March, 2025).  He is currently on propranolol  40 mg, and Dr. Geoffry told him to take 1 to 4 tablets as needed for tremor.  Pt is currently taking 40 mg, 1 po bid and pt notes it helps.    Tremor: Yes.     How long  has it been going on? 20 years but its gotten worse - started when he would hold something or if arm was rested on something.  Now more trouble with drinking and drinking out of cups with 2 hands and uses straws.  He is R handed but the L handed tremors more.  Tremor particularly got worse earlier this year and legs started more recently  At rest or with activation?  Both, worse with activation  Fam hx of tremor?   there is a strong family history of tremor, with deep brain stimulation done in patient's father for tremor.  Grandfather had tremor (paternal GF)  Affected by caffeine:  potentially but not really drinking this now because of CKD 4  Affected by alcohol:  doesn't drink enough to know if affects tremor  Affected by stress:  Yes.    Affected by fatigue:  Yes.    Spills soup if on spoon:  may or may not but usually will bring mouth to the spoon  Spills glass of liquid if full:  Yes.    Affects ADL's (tying shoes, brushing teeth, etc):  No.  Tremor inducing meds:  Yes.   Abilify  (added March, 2025); Depakote  (been on for many years and was on lithium prior to that)  Other Specific Symptoms:  Voice: little weaker Postural symptoms:  Yes.  , having balance change over the last 6 months - if he bends over to pick up something he will keep going over.  He sometimes uses a cane.  Wife notes that some of instability may have been  related to uncontrolled BS around thanksgiving but that is much better now.  Wife notes that he is very slow.  Wife notes seems unstable on the stairs but hasn't fallen on them. Loss of smell:  No. Loss of taste:  No. Appetite loss:  wife notes that he has lost appetite but pt notes that he has significant cravings because wife/daughter are trying to help him eat better.  He was previously on mounjaro and that is where weight loss came from.   Urinary Incontinence:  No. But has some intermittent fecal incontinence - had constipation on mounjaro but now on the other  end Difficulty Swallowing:  Yes.  , some with dry solids and I got to the point where I could not remember how to swallow a pill. Depression:  Yes.   But describes depression as frustrated Memory changes:  pt thinks ok but wife states that his short term is taking a hit.  Wife distributes pills since 06/2023 because his blood sugars were off and wasn't taking meds right.  Wife does finances and cooking and always has.  Pt doesn't drive and hasn't in the last 6 months b/c family noted that he was driving slow and weaving some on the road Hallucinations:  No.  visual distortions: No. N/V:  No. Lightheaded:  occasionally when first stands up  Syncope: No. Diplopia:  No.   Neuroimaging of the brain has not previously been performed.   PREVIOUS MEDICATIONS: currently on Depakote  and Abilify ; has previously been on other tremor inducers including perphenazine and lithium  ALLERGIES:   Allergies  Allergen Reactions   Codeine Swelling    Face numbness    Fenofibrate Swelling    Legs turn red and swell   Ibuprofen     Stage 4 Kidney failure. Cannot take.   Procardia [Nifedipine] Swelling   Sulfa Antibiotics Swelling    CURRENT MEDICATIONS:  Current Outpatient Medications  Medication Instructions   acetaminophen  (TYLENOL ) 650 mg, Oral, Every 6 hours PRN   ARIPiprazole  (ABILIFY ) 10 mg, Oral, Daily   b complex vitamins tablet 1 tablet, Daily at bedtime   BEE POLLEN PO 1 capsule, 2 times daily   Continuous Glucose Receiver (DEXCOM G7 RECEIVER) DEVI used to check blood sugar daily; Duration: 30 days   Continuous Glucose Sensor (DEXCOM G7 15 DAY SENSOR) MISC used to check blood sugar; Duration: 15 days   Cyanocobalamin (BL VITAMIN B-12 PO) 1 tablet, Daily   divalproex  (DEPAKOTE  ER) 500 MG 24 hr tablet TAKE ONE TABLET BY MOUTH DAILY IN THE MORNING AND TAKE THREE TABLETS AT BEDTIME   Docusate Sodium (DSS) 100 MG CAPS 4 capsules at bedtime Orally Once a day   febuxostat (ULORIC) 40 mg,  Daily   Fexofenadine  HCl (ALLERGY 24-HR PO) 1 tablet, Daily   fluticasone  (FLONASE ) 50 MCG/ACT nasal spray 1 spray, Daily   furosemide  (LASIX ) 20 mg, Daily   Multiple Vitamin (MULTIVITAMIN WITH MINERALS) TABS tablet 1 tablet, Daily   multivitamin-lutein (OCUVITE-LUTEIN) CAPS capsule 1 capsule, Every evening   NAC 1,200 mg, Oral, Every morning   Omega-3 Fatty Acids (FISH OIL) 1000 MG CAPS 3 capsules, 2 times daily   ondansetron  (ZOFRAN ) 4 mg, Oral, Every 6 hours PRN   propranolol  (INDERAL ) 40 mg, Oral, 2 times daily PRN   Vitamin C 4,000 mg, 2 times daily    Objective:   PHYSICAL EXAMINATION:    VITALS:  There were no vitals filed for this visit.  GEN:  The patient appears stated age  and is in NAD. HEENT:  Normocephalic, atraumatic.  The mucous membranes are moist. The superficial temporal arteries are without ropiness or tenderness. CV:  RRR Lungs:  CTAB Neck/HEME:  There are no carotid bruits bilaterally.  Neurological examination:  Orientation: The patient is alert and oriented x3.   There is psychomotor retardation and relies on wife for finer aspects of the history but answers are generally accurate.   Cranial nerves: There is good facial symmetry.  Extraocular muscles are intact. The visual fields are full to confrontational testing. The speech is fluent and clear. Soft palate rises symmetrically and there is no tongue deviation. Hearing is intact to conversational tone. Sensation: Sensation is intact to light touch throughout (facial, trunk, extremities). Vibration is intact at the bilateral big toe. There is no extinction with double simultaneous stimulation.  Motor: Strength is 5/5 in the bilateral upper and lower extremities.   Shoulder shrug is equal and symmetric.  There is no pronator drift. Deep tendon reflexes: Deep tendon reflexes are 2/4 at the bilateral biceps, triceps, brachioradialis, patella and achilles. Plantar responses are downgoing bilaterally.  Movement  examination: Tone: There is mild increased tone in the LLE.  Tone elsehwhere is normal.   Abnormal movements: There is very mild postural tremor.  He does have some mild intention tremor with finger-to-nose bilaterally.  He really does not have significant trouble with Archimedes spirals.  There is RUE rest tremor with distraction procedures only and LLE rest tremor that increases with distraction.  Has LUE rest tremor when writing with the R hand Coordination:  There is no decremation with RAM's Gait and Station: The patient pushes off to arise.  The patient's stride length is short stepped but not shuffling and he has decreased but not absent arm swing bilaterally.   I have reviewed and interpreted the following labs independently   Chemistry      Component Value Date/Time   NA 141 01/07/2021 0552   K 4.4 01/07/2021 0552   CL 112 (H) 01/07/2021 0552   CO2 20 (L) 01/07/2021 0552   BUN 33 (H) 01/07/2021 0552   CREATININE 2.14 (H) 01/07/2021 0552      Component Value Date/Time   CALCIUM  9.1 01/07/2021 0552   ALKPHOS 64 01/03/2021 1149   AST 30 01/03/2021 1149   ALT 33 01/03/2021 1149   BILITOT 0.7 01/03/2021 1149      Lab Results  Component Value Date   TSH 1.248 01/03/2021   Lab Results  Component Value Date   WBC 7.6 01/07/2021   HGB 11.9 (L) 01/07/2021   HCT 36.9 (L) 01/07/2021   MCV 100.0 01/07/2021   PLT 217 01/07/2021      Total time spent on today's visit was 84 minutes, including both face-to-face time and nonface-to-face time.  Time included that spent on review of records (prior notes available to me/labs/imaging if pertinent), discussing treatment and goals, answering patient's questions and coordinating care.  Cc:  Okey Carlin Redbird, MD   "

## 2024-09-03 ENCOUNTER — Encounter: Payer: Self-pay | Admitting: Neurology

## 2024-09-03 ENCOUNTER — Ambulatory Visit (INDEPENDENT_AMBULATORY_CARE_PROVIDER_SITE_OTHER): Admitting: Neurology

## 2024-09-03 ENCOUNTER — Other Ambulatory Visit

## 2024-09-03 DIAGNOSIS — R251 Tremor, unspecified: Secondary | ICD-10-CM

## 2024-09-03 DIAGNOSIS — E1169 Type 2 diabetes mellitus with other specified complication: Secondary | ICD-10-CM | POA: Insufficient documentation

## 2024-09-03 DIAGNOSIS — G20C Parkinsonism, unspecified: Secondary | ICD-10-CM | POA: Diagnosis not present

## 2024-09-03 DIAGNOSIS — G43709 Chronic migraine without aura, not intractable, without status migrainosus: Secondary | ICD-10-CM | POA: Insufficient documentation

## 2024-09-03 DIAGNOSIS — H9190 Unspecified hearing loss, unspecified ear: Secondary | ICD-10-CM | POA: Insufficient documentation

## 2024-09-03 DIAGNOSIS — R413 Other amnesia: Secondary | ICD-10-CM | POA: Diagnosis not present

## 2024-09-03 NOTE — Patient Instructions (Addendum)
 Your provider has requested that you have labwork completed today. The lab is located on the Second floor at [address], within the [facility] office. When you get off the elevator, turn right and go in the [facility] [address]; the first brown door on the left.  Tell the ladies behind the desk that you are there for lab work. If you are not called within 15 minutes please check with the front desk.   Once you complete your labs you are free to go. You will receive a call or message via MyChart with your lab results.     A referral to [facility] has been placed for your MRI someone will contact you directly to schedule your appt. They are located at [address] [facility]. Please contact them directly by calling [phone] with any questions regarding your referral.   You have been referred for a neurocognitive evaluation (i.e., evaluation of memory and thinking abilities). Please bring someone with you to this appointment if possible, as it is helpful for the neuropsychologist to hear from both you and another adult who knows you well. Please bring eyeglasses and hearing aids if you wear them and take any medications as you normally would. Please fully abstain from all alcohol, marijuana, or other substances prior to your appointment.   The evaluation will take approximately 2-3 hours and has two parts:   The first part is a clinical interview with the neuropsychologist, Dr. Richie or Dr. Gayland. During the interview, the neuropsychologist will speak with you and the individual you brought to the appointment.    The second part of the evaluation is testing with the doctor's technician, aka psychometrician, Dana or [employer]. During the testing, the technician will ask you to remember different types of material, solve problems, and answer some questionnaires. Your family member will not be present for this portion of the evaluation.   Please note: We have to reserve several  hours of the neuropsychologist's time and the psychometrician's time for your evaluation appointment. As such, there is a No-Show fee of $100. If you are unable to attend any of your appointments, please contact our office as soon as possible to reschedule.   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  You need to contact your prescribing physician or provider to discuss what we discussed today.  Do not just stop the medication on your own.  If you are able to get off of the medication,  it can take up to 6 months to resolve some of the symptoms that you are experiencing.  If you are able to get off of the medication AND your symptoms are not resolved in 6 months, then I want to see you back in the office for a follow up appointment.

## 2024-09-04 ENCOUNTER — Ambulatory Visit: Payer: Self-pay | Admitting: Neurology

## 2024-09-07 ENCOUNTER — Telehealth: Payer: Self-pay | Admitting: Psychiatry

## 2024-09-07 NOTE — Telephone Encounter (Signed)
 Pt called reporting the Abilify  he's taking may be causing  Parkinson symptoms. Pt was advised by Neurologist. Please advise asap. Contact # [phone]. Apt 2/10 & canc list

## 2024-09-08 NOTE — Telephone Encounter (Signed)
 Rtc to patient and he reports seeing Neurology on 1/9, Dr. Evonnie. He was referred by another provider for tremors/shakiness of both hands. Reports left hand worse. Pt reports its been happening for the last 2 months or so but started happening suddenly, he reports it can be constant or worsen at times when picking up objects. Pt reports Dr. Evonnie referred him back to Dr. Geoffry to discuss his Aripiprazole  being the cause.  Pt has not seen Dr. Geoffry since 04/2024 and it started after that. Pt has upcoming apt on 09/16/24 but is concerned. Informed him I would update Dr. Geoffry and follow up with him.

## 2024-09-08 NOTE — Telephone Encounter (Signed)
 I see him in 8 days.  We shouldn't change anything until then.

## 2024-09-09 NOTE — Telephone Encounter (Signed)
 Spoke to pt, no changes until his next appt, verbally understood

## 2024-09-10 LAB — HIV-1/2 AB - DIFFERENTIATION
HIV-1 antibody: NEGATIVE
HIV-2 Ab: NEGATIVE

## 2024-09-10 LAB — HIV ANTIBODY (ROUTINE TESTING W REFLEX)
HIV 1&2 Ab, 4th Generation: REACTIVE
HIV FINAL INTERPRETATION: NEGATIVE

## 2024-09-10 LAB — HIV-1 AND HIV-2 RNA, QUALITATIVE REAL-TIME PCR
HIV 1 RNA, QL RT PCR: NOT DETECTED
HIV 2 RNA, QL RT PCR: NOT DETECTED

## 2024-09-10 LAB — B12 AND FOLATE PANEL
Folate: >24 ng/mL
Vitamin B-12: >2000 pg/mL — ABNORMAL HIGH (ref 200–1100)

## 2024-09-10 LAB — TSH: TSH: 2.46 m[IU]/L (ref 0.40–4.50)

## 2024-09-11 ENCOUNTER — Ambulatory Visit
Admission: RE | Admit: 2024-09-11 | Discharge: 2024-09-11 | Disposition: A | Discharge status: Home / Self Care | Source: Ambulatory Visit | Attending: Neurology | Admitting: Neurology

## 2024-09-11 DIAGNOSIS — G20C Parkinsonism, unspecified: Secondary | ICD-10-CM

## 2024-09-11 DIAGNOSIS — R251 Tremor, unspecified: Secondary | ICD-10-CM

## 2024-09-11 DIAGNOSIS — R413 Other amnesia: Secondary | ICD-10-CM

## 2024-09-14 ENCOUNTER — Ambulatory Visit: Payer: Self-pay | Admitting: Psychology

## 2024-09-14 DIAGNOSIS — F0670 Mild neurocognitive disorder due to known physiological condition without behavioral disturbance: Secondary | ICD-10-CM | POA: Diagnosis not present

## 2024-09-14 DIAGNOSIS — R4189 Other symptoms and signs involving cognitive functions and awareness: Secondary | ICD-10-CM

## 2024-09-14 NOTE — Progress Notes (Unsigned)
 "  NEUROPSYCHOLOGICAL EVALUATION [place]. [facility]  [place] Department of Neurology  Date of Evaluation: 09/14/2024  REASON FOR REFERRAL   Adam Bowman is a 59 year old, right-handed, White male with 16 years of formal education. He was referred for neuropsychological evaluation by his neurologist, Asberry Tat, D.O., to assess current neurocognitive functioning, document potential cognitive deficits, and assist with treatment planning. This is his first neuropsychological evaluation.  SUMMARY OF RESULTS   Premorbid cognitive abilities are estimated to be in the above average range based on word reading and sociodemographic factors. Relative to this baseline estimate, current performance was variable across all cognitive domains.  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 Processing speed was generally adequate on simpler tasks, with the exception of rapid color naming, but slowed on tasks with greater executive demands. Performance on response inhibition tasks was characterized by both slowed speed and increasing errors as task demands increased. Although not normatively impaired, he was somewhat slower than expected on an alternating attention task and committed one error. Problem-solving performance on a card sorting task was below expectations, as he identified only one sorting principle before the task ended. Verbal abstract reasoning was relatively preserved. Verbal fluency was notably below expectations, whereas confrontation naming was intact. He demonstrated intact visuoconstructional skills, with relatively weaker performance on visuoperceptual tasks, especially on a task requiring the judgment of line orientations.  On measures of learning/memory, he demonstrated adequate encoding, recall, and recognition of short stories. Immediate recall of a word list was at the lower end of the normal range; however, he retained most of  the information after a delay and benefited from recognition cueing, with no false-positive errors. Shape recognition was intact immediately but less efficient after a delay until presented in a forced-choice format.  On self-report questionnaires, he endorsed severe symptoms of depression and anxiety.  DIAGNOSTIC IMPRESSION   Results of the current evaluation indicated variability across all cognitive domains, with the most consistent deficits being observed across tasks of executive functioning. Although he receives assistance with many instrumental activities of daily living, it is not entirely clear to what extent he would be unable to perform these tasks independently. Some responsibilities were historically managed by his wife, some were discontinued proactively, and others he no longer performs directly but remains involved in tracking or oversight. Taken together, findings are most supportive of a diagnosis of mild neurocognitive disorder (mild cognitive impairment) at this time.  It is possible that some of the observed weaknesses are related to the history of essential tremor, as individuals with essential tremor can demonstrate difficulties primarily in executive functioning, verbal fluency, and working memory. However, the visuospatial weaknesses identified would be somewhat unexpected in this context. Patient also reports significant symptoms of depression and anxiety, as well as difficulty adjusting to recent health changes, all of which can exacerbate cognitive difficulties. His wifes report of psychomotor slowing is notable but remains nonspecific, as this finding can be associated with a range of neurological and psychiatric conditions, including depression and drug-induced parkinsonism.  Once neuroimaging becomes available, cognitive findings can be considered in that context to determine whether additional factors, such as cerebrovascular disease, may be contributing, particularly given  his vascular risk factors. In the interim, it will be important for the patient to address mood symptoms and maintain adequate glycemic control, as both can impact cognitive functioning. Consideration may also be given to repeat neuropsychological evaluation following stabilization of these factors, which may help further clarify  etiology.  ICD-10 Codes: F06.70 Mild neurocognitive disorder (mild cognitive impairment)  RECOMMENDATIONS   In consultation with your doctor, schedule cognitive reevaluation on an as-needed basis to assess for cognitive decline and update treatment recommendations. Reevaluation should occur during a period of medical and affective stability.  Continue mental health treatment, especially given that emotional distress can exacerbate cognitive difficulties. Discuss current medication regimen with your prescribing provider to ensure you are receiving maximum benefit. If symptoms begin to interfere with daily functioning, you may wish to consider exploring additional treatment options, such as mindfulness, relaxation techniques, or counseling.  Prioritize physical health through diet, exercise, and sleep. Regular physical activity supports cardiovascular health, improves mood, and helps preserve mobility and independence. Aim for at least 150 minutes of moderate aerobic exercise per week (e.g., brisk walking, swimming, gardening). A brain-healthy diet such as the Mediterranean or MIND diet is rich in fruits, vegetables, whole grains, healthy fats, and lean proteins, and has been associated with reduced risk of cognitive decline. Additionally, getting adequate, quality sleep and managing chronic conditions with the help of healthcare providers are essential components of healthy aging.  Continue to stay socially and mentally engaged. Maintaining strong social connections and regularly stimulating your brain can help protect against cognitive decline. This includes staying connected  with friends and family, volunteering, or participating in community groups. Mentally engaging activities--such as reading, doing puzzles, playing strategy games, or learning a new language or musical instrument--promote brain plasticity. If you are interested in activities to support cognitive engagement, this site offers a variety of apps and games organized by difficulty level:  https://www.[facility].org/get-to-know-barrow/centers-programs/neurorehabilitation-center/neuro-rehab-apps-and-games/  Consider implementing compensatory strategies to maximize independence and maintain daily functioning. Examples include:  Adhere to routine. Compensatory strategies work best when they are used consistently. Use a planner, calendar, or white board that has the schedule and important events for the day clearly listed to reference and cross off when tasks are complete.  Ask for written information, especially if it is new or unfamiliar (e.g., information provided at a doctor's appointment).  Create an organized environment. Keep items that can be easily misplaced in a sensible location and get into the habit of always returning the items to those places. Pay attention and reduce distractions. Make a point of focusing attention on information you want to remember. One-on-one interaction is more likely to facilitate attention and minimize distraction. Make eye contact and repeat the information out loud after you hear it. Reduce interruptions or distractions especially when attempting to learn new information.  Create associations. When learning something new, think about and understand the information. Explain it in your own words or try to associate it with something you already know. Take notes to help remember important details. Evaluate goals and plan accordingly. When confronted by many different tasks, begin by making a list that prioritizes each task and estimates the time it will take to complete. Break  down complicated tasks into smaller, more manageable steps. Focus on one task at a time and complete each task before starting another. Avoid multitasking.  Patient is not currently driving. If he wishes to resume driving and his family is uncertain about whether it is safe, he may consider undergoing a formal driving evaluation. The following agencies can be contacted for such assessments:  [facility] in [place]: [phone] [facility] in [place]: [phone] [facility] in [place]: [phone] [facility] in [place]: [phone] or [phone]  DISPOSITION   No follow-up neuropsychological testing was scheduled at this  time. Please feel free to refer the patient for repeated evaluation if he shows a significant change in neurocognitive status. He and his wife will be provided verbal feedback in approximately one week regarding the findings and impression during this visit.  The remainder of the report includes the details of the patient's background and a table of results from the current evaluation, which support the summary and recommendations described above.  BACKGROUND   History of Presenting Illness: The following information was obtained from a review of medical records and an interview with the patient and his wife, Sherrell. Briefly, the patient was evaluated by Dr. Evonnie at [facility] on 09/03/2024 for a longstanding history of tremor that has dramatically worsened over the past year, possibly due to medication side effects. During this visit, concern was also raised regarding short-term memory difficulties, prompting a referral for neuropsychological evaluation. For example, his wife assumed responsibility for managing his medications over a year ago after noticing that he was not appropriately taking his medications to control his blood sugar. Patient generally feels that his memory is adequate.  Cognitive  Functioning: During todays appointment, the patient reported minimal cognitive concerns aside from feeling somewhat foggy, with perceived slowing in processing speed and occasional word-finding difficulties. He described a sense that his mind knows what it intends to do, but his body does not always follow through. He denied significant difficulties with memory or navigation. He was uncertain about his executive functioning, as his wife and children manage most daily tasks and responsibilities. Patients wife reported that she has noticed cognitive changes over the past year, with more subtle concerns present for a longer period, such as family members saying, we told you that. She questioned whether these changes may be related to poor blood sugar control, physical decline, or heightened vigilance given that they care for her mother with Alzheimers disease. She described short-term memory difficulties, including forgetting conversations, though she denied repetitive questioning or statements. She noted that his brain has slowed down and that it takes him longer to verbalize his thoughts, which represents a notable change from his prior functioning. She reported that his attention improves when direct eye contact; however, if information is shared without securing his attention, he is unlikely to recall the conversation. She also noted that he has historically been inclined to make puns, though these now appear less integrated into his daily conversation.  Physical Functioning: Patient denied difficulties with sleep initiation or maintenance. His wife reported that he has been sleeping more than usual, which she attributes to anemia. His appetite is generally stable, with no reported changes in taste or smell. He acknowledged occasional food cravings but noted that he is unable to indulge at times due to blood sugar management. He continues to experience some difficulty swallowing dry foods and requires  increased liquid intake to facilitate swallowing. Vision is reported as generally stable. He has hearing loss and uses bilateral hearing aids. He reported new-onset incontinence. He also described balance difficulties with several recent falls and a subjective sense of slowed movement. Aside from a longstanding hand tremor, greater on the left than the right and present both at rest and with action, he now reports tremors involving his legs and jaw.  Emotional Functioning: Patient described his mood as not at the top, rating it as a 7 out of 10, with 10 representing his best mood. He denied any suicidal ideation. He reported difficulty adjusting to his multiple physical and medical changes, expressing distress about these  changes and a perceived inability to do anything to improve or address them.  Neuroimaging: MRI of the brain was completed on 09/11/2024; final report is not yet available.  Other Relevant Medical History: Remarkable for hypertension, hyperlipidemia, type 2 diabetes with neuropathy, chronic migraine, and chronic kidney disease. Please refer to the medical record for a more comprehensive problem list. No history of stroke, CNS infection, head injury, or seizure was reported.  Current Medications: Per record, acetaminophen , aripiprazole , divalproex , febuxostat, fexofenadine  hydrochloride fluticasone , furosemide , Ocuvite with lutein, omega-3 fatty acids, ondansetron , propranolol , vitamin B12, and vitamin C.   Functional Status: Patient discontinued driving approximately three months ago after family members expressed concern about his driving. 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 Prior to this recent decision, he had no major traffic violations, motor vehicle accidents, or instances of getting lost. Patients wife has assumed responsibility for managing his medications, as he was  sometimes unable to locate them despite them being kept in the same place, which led to poor blood sugar control. She now organizes his medications in a pillbox and typically administers them to him. She is uncertain whether he would be adherent if he were responsible for taking them independently. However, she reports that he generally tracks his medications and will note if a dose has not yet been administered to him. She also manages the finances and prepares meals, though this has been their longstanding arrangement. Patient remains independent in all basic activities of daily living.  Family Neurological History: Remarkable for tremor in the patient's father and paternal grandfather.  Psychiatric History: Remarkable for bipolar disorder and anxiety, currently managed with medication prescribed by psychiatry. History of counseling, suicidal ideation, hallucinations, and psychiatric hospitalizations was not reported.  Substance Use History: Patient reported infrequent alcohol consumption and denied current use of nicotine, marijuana, and other illicit substances was denied. Additionally, there is no reported history of past problematic substance use.  Social and Developmental History: Patient was born in [place] due to his [employer] background. He never learned to speak [place], except for a few words. History of perinatal complications and developmental delays was not reported. He is married and has two daughters and a son. He lives with his wife, their two daughters, and his mother-in-law.  Educational and Occupational History: Patient described himself as an Chiropodist, except in handwriting and physical education. He stated that he had a learning disability because he struggled to draw or write in a straight line; however, when asked if he had any difficulties with reading or writing, he denied any problems. Math was never his strongest subject, but he was able to pass all of his classes. He  reported no history of grade retention and noted that he actually skipped the second grade, progressing from first to third grade. He earned a bachelor's degree in music education. 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 Around 2005, he stopped working to focus on household responsibilities while his wife managed work outside the home.  BEHAVIORAL OBSERVATIONS   Patient arrived late and was accompanied by his wife, Sherrell. He ambulated independently and without gait disturbance. He was alert and fully oriented. He was appropriately groomed and dressed for the setting. Tremor was observed in his hands. Vision (with glasses) and hearing (with hearing aids) were adequate  for testing purposes. Speech was slowed but of normal prosody and volume. No conversational word-finding difficulties, paraphasic errors, or dysarthria were observed. Comprehension was conversationally intact. Thought processes were linear, logical, and coherent. Thought content was organized and devoid of delusions. Insight appeared appropriate. Affect was generally flat, even when he occasionally made jokes. He was cooperative and appeared to give adequate effort during testing, including on standalone and embedded measures of performance validity. Results are thought to accurately reflect his cognitive functioning at this time.  NEUROPSYCHOLOGICAL TESTING RESULTS   Tests Administered: Animal Naming Test; Controlled Oral Word Association Test (COWAT): FAS; Delis-Kaplan Executive Function System (D-KEFS) - Subtest(s): Color-Word Interference Test; Geriatric Anxiety Scale-10 Item (GAS-10); Geriatric Depression Scale Short Form (GDS-SF); Hopkins Verbal Learning Test-Revised (HVLT-R) - From 1; Judgment of Line Orientation (JLO) - Form H; Neuropsychological Assessment Battery (NAB) - Form 1: Subtest(s): Naming, Visual Discrimination; Standalone performance validity  test (PVT); Test of Premorbid Functioning (TOPF); Trail Making Test (TMT); Wechsler Adult Intelligence Scale Fifth Edition (WAIS-5) - Subtest(s): Similarities, Clinical Cytogeneticist, Digit Sequencing, Coding, Running Digits, Symbol Search, Symbol Span; Wechsler Memory Scale Fourth Edition (WMS-IV) - Subtest(s): Logical Memory (LM); and [place]  Card Sorting Test 64 Card Version (WCST-64).  Test results are provided in the table below. Whenever possible, the patient's scores were compared against age-, sex-, and education-corrected normative samples. Interpretive descriptions are based on the AACN consensus conference statement on uniform labeling (Guilmette et al., 2020).  PREMORBID FUNCTIONING RAW  RANGE  TOPF 64 StdS=121 Above Average  ATTENTION & WORKING MEMORY RAW  RANGE  WAIS-5 Digit Sequencing -- ss=8 Average  WAIS-5 Running Digits -- ss=9 Average  WAIS-5 Symbol Span -- ss=5 Below Average  PROCESSING SPEED RAW  RANGE  Trails A 35''0e T=44 Average  WAIS-5 Naming Speed Quantity -- ss=8 Average  DKEFS CWIT Color Naming 57''3e ss=1 Exceptionally Low  DKEFS CWIT Word Reading 32''0e ss=6 Low Average  EXECUTIVE FUNCTION RAW  RANGE  Trails B 114''1e T=37 Low Average  WAIS-5 Similarities -- ss=10 Average  COWAT Letter Fluency 10+8+5 T=33 Below Average  DKEFS CWIT Inhibition 137''5e ss=1 Exceptionally Low  DKEFS CWIT Inhibition/Switching 156''18e ss=1 Exceptionally Low  WCST-64 Total Errors 28 T=36 Below Average  WCST-64 Perseverative Errors 11 T=41 Low Average  WCST-64 Nonperseverative Errors 17 T=31 Below Average  WCST-64 Categories Completed 1 6-10%ile Below Average to Low Average  WCSR-64 FMS 0 -- --  LANGUAGE RAW  RANGE  COWAT Letter Fluency 10+8+5 T=33 Below Average  Animal Naming Test 8 T=19 Exceptionally Low  NAB Naming Test 29/31 T=39 WNL  VISUOSPATIAL RAW  RANGE  WAIS-5 Block Design -- ss=8 Average  NAB Visual Discrimination 14/18 T=39 Low Average  JLO C/S=10/30 1.5%ile Exceptionally  Low  VERBAL LEARNING & MEMORY RAW  RANGE  HVLT-R Learning Trials (7+7+9)/36 T=39 Low Average  HVLT-R Delayed Recall 6/12 T=30 Below Average  HVLT-R Recognition Hits 11 -- --  HVLT-R Recognition False Positives 0 -- --  HVLT-R Discrimination Index 11 T=52 Average  WMS-IV LM-I** (11+13+15)/53 ss=13 High Average  WMS-IV LM-II** (10+14)/39 ss=12 High Average  WMS-IV LM Recognition** (7+14)/23 51-75%ile Average  VISUAL LEARNING & MEMORY RAW  RANGE  NAB Shape Learning IR (6+5+5)/27 T=49 Average  NAB Shape Learning DR 3/9 T=29 Exceptionally Low  NAB Shape Learning DFCR 6h, 24fa T=47 Average  QUESTIONNAIRES RAW  RANGE  GDS-SF 13 -- Severe  GAS-10 13 -- Severe  *Note: ss = scaled score; StdS = standard score; T = t-score; C/S = corrected raw  score; WNL = within normal limits; BNL= below normal limits; D/C = discontinued. Scores from skewed distributions are typically interpreted as WNL (>=16th %ile) or BNL (<16th %ile). **Patient was administered the older adult version of the test in error; scores should be interpreted with caution.   INFORMED CONSENT   Patient was provided with a verbal description of the nature and purpose of the neuropsychological evaluation. Also reviewed were the foreseeable risks and/or discomforts and benefits of the procedure, limits of confidentiality, and mandatory reporting requirements of this provider. Patient was given the opportunity to have their questions answered. Oral consent to participate was provided by the patient.   This report was prepared as part of a clinical evaluation and is not intended for forensic use.  SERVICE   This evaluation was conducted by Renda Beckwith, Psy.D. In addition to time spent directly with the patient, total professional time (180 minutes) includes record review, integration of relevant medical history, test selection, interpretation of findings, and report preparation. A technician, Lonell Jude, B.S., provided testing and  scoring assistance (150 minutes).  Psychiatric Diagnostic Evaluation Services (Professional): 09208 x 1 Neuropsychological Testing Evaluation Services (Professional): [phone] x 1 Neuropsychological Testing Evaluation Services (Professional): 03866 x 2 Neuropsychological Test Administration and Scoring (Technician): [phone] x 1 Neuropsychological Test Administration and Scoring (Technician): [phone] x 4  This report was generated using voice recognition software. While this document has been carefully reviewed, transcription errors may be present. I apologize in advance for any inconvenience. Please contact me if further clarification is needed.            Renda Beckwith, Psy.D.             Neuropsychologist  "

## 2024-09-14 NOTE — Progress Notes (Signed)
" ° °  Psychometrician Note   Cognitive testing was administered to Debby KATHEE Goods by Lonell Jude, B.S. (psychometrist) under the supervision of Dr. Renda Beckwith, Psy.D., licensed psychologist on 09/14/2024. Mr. Hamman did not appear overtly distressed by the testing session per behavioral observation or responses across self-report questionnaires. Rest breaks were offered.   The battery of tests administered was selected by Dr. Renda Beckwith, Psy.D. with consideration to Mr. Mellinger current level of functioning, the nature of his symptoms, emotional and behavioral responses during interview, level of literacy, observed level of motivation/effort, and the nature of the referral question. This battery was communicated to the psychometrist. Communication between Dr. Renda Beckwith, Psy.D. and the psychometrist was ongoing throughout the evaluation and Dr. Renda Beckwith, Psy.D. was immediately accessible at all times. Dr. Renda Beckwith, Psy.D. provided supervision to the psychometrist on the date of this service to the extent necessary to assure the quality of all services provided.    Debby KATHEE Goods will return within approximately 1-2 weeks for an interactive feedback session with Dr. Beckwith at which time his test performances, clinical impressions, and treatment recommendations will be reviewed in detail. Mr. Mcqueary understands he can contact our office should he require our assistance before this time.  A total of 150 minutes of billable time were spent face-to-face with Mr. Rybarczyk by the psychometrist. This includes both test administration and scoring time. Billing for these services is reflected in the clinical report generated by Dr. Renda Beckwith, Psy.D.  This note reflects time spent with the psychometrician and does not include test scores or any clinical interpretations made by Dr. Beckwith. The full report will follow in a separate note. "

## 2024-09-16 ENCOUNTER — Ambulatory Visit: Admitting: Psychiatry

## 2024-09-16 ENCOUNTER — Encounter: Payer: Self-pay | Admitting: Psychiatry

## 2024-09-16 DIAGNOSIS — F0670 Mild neurocognitive disorder due to known physiological condition without behavioral disturbance: Secondary | ICD-10-CM

## 2024-09-16 DIAGNOSIS — F319 Bipolar disorder, unspecified: Secondary | ICD-10-CM | POA: Diagnosis not present

## 2024-09-16 DIAGNOSIS — G2111 Neuroleptic induced parkinsonism: Secondary | ICD-10-CM | POA: Diagnosis not present

## 2024-09-16 DIAGNOSIS — G250 Essential tremor: Secondary | ICD-10-CM | POA: Diagnosis not present

## 2024-09-16 NOTE — Progress Notes (Signed)
 Adam Bowman [id number] 1966/08/21 59 y.o.  Subjective:   Patient ID:  Adam Bowman is a 59 y.o. (DOB 10/19/1965) male.  Chief Complaint:  Chief Complaint  Patient presents with   Follow-up   Manic Behavior   Medication Reaction   Memory Loss    HPI Adam Bowman presents to the office today for follow-up of bipolar disorder.  seen 04/2019 & 04/2020 without med changes.  05/16/21 appt noted: No unusual mood swings.  No SE issues. Consistent with meds. No health problems.  Ongoing chronic family stressors.  Caring for M with dementia.   Usually sleep OK. No med changes  05/14/22 appt noted: Psychiatric meds include Depakote  ER 500 mg in the morning and 1500 mg nightly.  No other psychiatric meds. Lowsy physically.  Stage 4 kidney failure and had to change some other meds. Rebelsis constipation and nausea.   Lost from 227 to 211# since June.   Irritable and thinks physically feeling bad causes some of this. Supervises care of M with dementia.  D's do most of the care.  She is less verbal and fusses at people that aren't there. Doing fine with mood. No swings.  No illnesses. Remains stable on 2000 mg Depakote  daily. No problems with the Depakote .  Wants to have daughters switch to Depakote  from lithium bc he's in stage 4 CKD.   Patient reports stable mood and denies depressed.  Less patient than I could be.  Patient denies any recent difficulty with anxiety.  Patient denies difficulty with sleep initiation or maintenance except for physical sx. Denies appetite disturbance.  Patient reports that energy and motivation have been good.  Patient denies any difficulty with concentration.  Patient denies any suicidal ideation. No SE  05/16/2023: Appointment noted: Of note is children complain that he is lethargic, inactive and more forgetful lately.  He has been more dependent on his daughters than in the past. Off testosterone bc didn't think it was helpful.  No different off it for a couple  of mos. Plugging along.   Doesn't follow glucose.  Lost 30#.  Was having N on Rebelsius.   Took Ozempic then Mounjaro now.   No sig change in conc, memory noticed lately.  Can word search in conversation.   When taken B caps had better energy, but stopped DT costs.  Plan: Continue Depakote  ER 500 mg in the AM and 1500 mg at night. Check VPA level and ammonia level bc tremor and lethargy family notices.  07/16/23 appt noted:  Reduced Depakote  to 1500 mg daily and W thinks he's sleeping more.  Not more irritable as far as he notices.     Still gets shakes at times and asks about trying propranolol  for intermittent tremor.  No difference in tremor with reduction in VPA.   Tremor affects writing.      10/29/23 appt noted: Med: reduced VPA 500 mg AM &1500 HS, propranool 10-40- BID prn tremor, taking 40 AM Tremor better with propranolol .    Lasts about 8 hours. No SE Mood pretty good overall but times of less patient and more snappy.   Still losing weight on Mounjaro.  Reduced appetite. Plan: add Abilify  5 for irritablity  05/28/23 ammonia normal   12/26/23 appt noted: Med: Abilify  10, VPA 500 mg AM &1500 HS, propranool 10-40- BID prn tremor, taking 40 AM Abilify  helped irritability much better.  Not as snappy.   Feel so much better. No strong feelings about trying to wean VPA  and use Abilify  solo;  understanding risks of doing so. Patient reports stable mood and denies depressed or irritable moods.  Patient denies any recent difficulty with anxiety.  Patient denies difficulty with sleep initiation or maintenance. Denies appetite disturbance.  Patient reports that energy and motivation have been good.  Patient denies any difficulty with concentration.  Patient denies any suicidal ideation. No other SE .  04/29/24 appt noted:  Med: out of Abilify  10, Depakote  500 mg AM and 1500 mg HS, propranolol  40 AM Had episode of weakness lasting seriously 1 day and then gradually better.  Episode happened off  Abilify . Has DM unstable at times.   Overall mood is very good at 10 mg .  Really in control and calm 99% of the time. Propranolol  helps tremor significantly Plan: Resume Abilify   09/07/24 TC:  Pt called reporting the Abilify  he's taking may be causing  Parkinson symptoms. Pt was advised by Neurologist. Please advise asap.    Nurse note:  Rtc to patient and he reports seeing Neurology on 1/9, Dr. Evonnie. He was referred by another provider for tremors/shakiness of both hands. Reports left hand worse. Pt reports its been happening for the last 2 months or so but started happening suddenly, he reports it can be constant or worsen at times when picking up objects. Pt reports Dr. Evonnie referred him back to Dr. Geoffry to discuss his Aripiprazole  being the cause.  Pt has not seen Dr. Geoffry since 04/2024 and it started after that. Pt has upcoming apt on 09/16/24 but is concerned. Informed him I would update Dr. Geoffry and follow up with him.     MD resp:  I see him in 8 days.  We shouldn't change anything until then.    Lorene Geoffry, MD, DFAPA  Saw Dr. Evonnie 09/03/24 ref by Dr. Dale Gull:   Patient's tremor is certainly complex. Likely has longstanding essential tremor, with strong family history of such, and finds propranolol  effective for this. Discussed with them that Depakote  can also contribute to this type of postural tremor. However, symptoms have dramatically worsened over the last year, now having rest tremor and leg tremor and difficulty walking. 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 We discussed nature and pathophysiology of this. I did not tell him to stop the Abilify , but rather to talk to psychiatrist about the medication.  Cognitive impairment, under evaluation Cognitive impairment with short-term memory issues. Possible impact from medications, mood, and other factors. Lifelong learning disability, but patient does have a bachelor's  degree - Ordered MRI of the brain. - Scheduled neurocognitive testing with neuropsychologist. - Lab work today including B12, folate, TSH, HIV (had false positive HIV test several years ago that was repeated and was negative)  09/16/24 appt noted:  seen with wife Med: Abilify  10, Depakote  500 mg AM and 1500 mg HS, propranolol  40 AM Per wife and pt tremor can worse when doing something.  But  Propranolol  was handling tremor fairly well before Abilify . W says better control of irritability with Abilify  50-80 % better.  Not hollering or screaming like before.   Mood still better with Abilify  but concerned about tremor.   Disc cog issues.   Not driving bc drifting side to side in the road.  Scared family.    Sig anemia and pending workup.   Past Psychiatric Medication Trials:  Lithium, Depakote  2000,  perphenazine EPS, Wellbutrin Abilify   GF and F had tremor and  F had DBS for tremor  Review of Systems:  Review of Systems  Endocrine: Negative for polyuria.  Genitourinary:        Nocturia  Musculoskeletal:  Positive for arthralgias and myalgias.  Neurological:  Negative for tremors and weakness.  Psychiatric/Behavioral:  Positive for dysphoric mood. Negative for agitation.    F and GF had tremor and F had brain implants.    Medications: I have reviewed the patient's current medications.  Current Outpatient Medications  Medication Sig Dispense Refill   ARIPiprazole  (ABILIFY ) 10 MG tablet Take 1 tablet (10 mg total) by mouth daily. 90 tablet 1   Ascorbic Acid (VITAMIN C) 500 MG CHEW Chew 4,000 mg by mouth in the morning and at bedtime. Take 8 tablets BID     divalproex  (DEPAKOTE  ER) 500 MG 24 hr tablet TAKE ONE TABLET BY MOUTH DAILY IN THE MORNING AND TAKE THREE TABLETS AT BEDTIME 360 tablet 1   multivitamin-lutein (OCUVITE-LUTEIN) CAPS capsule Take 1 capsule by mouth every evening.     Omega-3 Fatty Acids (FISH OIL) 1000 MG CAPS Take 3 capsules 2 (two) times daily by mouth.      propranolol  (INDERAL ) 40 MG tablet Take 1 tablet (40 mg total) by mouth 2 (two) times daily as needed (tremor). 180 tablet 2   acetaminophen  (TYLENOL ) 325 MG tablet Take 2 tablets (650 mg total) by mouth every 6 (six) hours as needed for mild pain (or Fever >/= 101).     Acetylcysteine (NAC) 600 MG CAPS Take 2 capsules (1,200 mg total) by mouth every morning. (Patient not taking: Reported on 09/03/2024) 60 capsule 2   b complex vitamins tablet Take 1 tablet by mouth at bedtime. (Patient not taking: Reported on 09/16/2024)     BEE POLLEN PO Take 1 capsule by mouth in the morning and at bedtime. (Patient not taking: Reported on 09/03/2024)     Continuous Glucose Receiver (DEXCOM G7 RECEIVER) DEVI used to check blood sugar daily; Duration: 30 days     Continuous Glucose Sensor (DEXCOM G7 15 DAY SENSOR) MISC used to check blood sugar; Duration: 15 days     Cyanocobalamin (BL VITAMIN B-12 PO) Take 1 tablet by mouth daily.     Docusate Sodium (DSS) 100 MG CAPS 4 capsules at bedtime Orally Once a day (Patient not taking: Reported on 09/03/2024)     febuxostat (ULORIC) 40 MG tablet Take 40 mg by mouth daily.     Fexofenadine  HCl (ALLERGY 24-HR PO) Take 1 tablet by mouth daily.     fluticasone  (FLONASE ) 50 MCG/ACT nasal spray Place 1 spray into both nostrils daily.     furosemide  (LASIX ) 20 MG tablet Take 20 mg by mouth daily.     Multiple Vitamin (MULTIVITAMIN WITH MINERALS) TABS tablet Take 1 tablet daily by mouth.     ondansetron  (ZOFRAN ) 4 MG tablet Take 1 tablet (4 mg total) by mouth every 6 (six) hours as needed for nausea. 20 tablet 0   No current facility-administered medications for this visit.    Medication Side Effects: None  Allergies:  Allergies  Allergen Reactions   Codeine Swelling    Face numbness    Fenofibrate Swelling    Legs turn red and swell   Ibuprofen     Stage 4 Kidney failure. Cannot take.   Procardia [Nifedipine] Swelling   Sulfa Antibiotics Swelling    Past Medical  History:  Diagnosis Date   Bipolar disorder (HCC)    CKD stage 4 due to type  2 diabetes mellitus (HCC)    Diabetes (HCC)    Hyperlipidemia    Hypertension     Family History  Problem Relation Age of Onset   Tremor Father    Kidney failure Brother    Tremor Daughter     Social History   Socioeconomic History   Marital status: Married    Spouse name: Not on file   Number of children: Not on file   Years of education: Not on file   Highest education level: Not on file  Occupational History   Not on file  Tobacco Use   Smoking status: Never   Smokeless tobacco: Never  Substance and Sexual Activity   Alcohol use: Yes    Comment: rarely   Drug use: Not Currently   Sexual activity: Not on file  Other Topics Concern   Not on file  Social History Narrative   Right handed   More left than right     Social Drivers of Health   Tobacco Use: Low Risk (09/16/2024)   Patient History    Smoking Tobacco Use: Never    Smokeless Tobacco Use: Never    Passive Exposure: Not on file  Financial Resource Strain: Not on file  Food Insecurity: Not on file  Transportation Needs: Not on file  Physical Activity: Not on file  Stress: Not on file  Social Connections: Not on file  Intimate Partner Violence: Not on file  Depression (EYV7-0): Not on file  Alcohol Screen: Not on file  Housing: Not on file  Utilities: Not on file  Health Literacy: Not on file    Past Medical History, Surgical history, Social history, and Family history were reviewed and updated as appropriate.   Please see review of systems for further details on the patient's review from today.   Objective:   Physical Exam:  There were no vitals taken for this visit.  Physical Exam Constitutional:      General: He is not in acute distress.    Appearance: He is well-developed. He is obese.  Musculoskeletal:        General: No deformity.  Neurological:     Mental Status: He is alert and oriented to person,  place, and time.     Motor: Tremor present.     Coordination: Coordination normal.     Comments: Mild increase motor tone UE R>L  Psychiatric:        Attention and Perception: Attention and perception normal. He does not perceive auditory or visual hallucinations.        Mood and Affect: Mood is depressed. Mood is not anxious. Affect is blunt. Affect is not labile, flat, angry, tearful or inappropriate.        Speech: Speech normal.        Behavior: Behavior is slowed.        Thought Content: Thought content normal. Thought content is not delusional. Thought content does not include homicidal or suicidal ideation. Thought content does not include suicidal plan.        Cognition and Memory: Cognition and memory normal.        Judgment: Judgment normal.     Comments: Insight intact. No delusions.  Irritable resolved with Abilify  10 Mildly slowed      Lab Review:     Component Value Date/Time   NA 141 01/07/2021 0552   K 4.4 01/07/2021 0552   CL 112 (H) 01/07/2021 0552   CO2 20 (L) 01/07/2021 0552   GLUCOSE 127 (  H) 01/07/2021 0552   BUN 33 (H) 01/07/2021 0552   CREATININE 2.14 (H) 01/07/2021 0552   CALCIUM  9.1 01/07/2021 0552   PROT 7.6 01/03/2021 1149   ALBUMIN 3.5 01/03/2021 1149   AST 30 01/03/2021 1149   ALT 33 01/03/2021 1149   ALKPHOS 64 01/03/2021 1149   BILITOT 0.7 01/03/2021 1149   GFRNONAA 35 (L) 01/07/2021 0552       Component Value Date/Time   WBC 7.6 01/07/2021 0552   RBC 3.69 (L) 01/07/2021 0552   HGB 11.9 (L) 01/07/2021 0552   HCT 36.9 (L) 01/07/2021 0552   PLT 217 01/07/2021 0552   MCV 100.0 01/07/2021 0552   MCH 32.2 01/07/2021 0552   MCHC 32.2 01/07/2021 0552   RDW 12.8 01/07/2021 0552   LYMPHSABS 2.9 01/07/2021 0552   MONOABS 0.5 01/07/2021 0552   EOSABS 0.1 01/07/2021 0552   BASOSABS 0.0 01/07/2021 0552    No results found for: POCLITH, LITHIUM   Lab Results  Component Value Date   VALPROATE 88 05/23/2023    Checked VPA level and  ammonia level bc tremor and lethargy family notices. Lab Results  Component Value Date   VALPROATE 88 05/23/2023  05/23/23 ammonia low at 38 .res Assessment: Plan:    Adam Bowman was seen today for follow-up, manic behavior, medication reaction and memory loss.  Diagnoses and all orders for this visit:  Bipolar I disorder (HCC)  Mild neurocognitive disorder due to another medical condition  Benign essential tremor  Neuroleptic-induced parkinsonism   50 min urgent session evaluating, disc neuro results and implications of same and alternative meds and SE :  Neruo appt a couple of days ago  Extensive review of Neurology notes and neuropsych testing Jan 2026 and noted above. DX mild neurocognitive impairment and Parkinsonism. Reviewed different med options if stop Abiilify and risk mood sx getting worse.  He has tried the only reasonable FDA approved mood stabilizer options that are not also antipsychotics.   Disc concerns about longterm lithium in the past contributing to his CKD.  Good stability and good response wituout SE.  Continue Depakote  ER 500 mg in the AM and 1500 mg at night.  Disc tremors and caffeine and propranolol .   Irritable resolved with Abilify  10 but worsening tremor.   DC Abilify  and wait until mood sx return and then start Rexulti 1 mg daily which has less EPS risk vs Abilify   Constipation management 1.  Lots of water 2.  Powdered fiber supplement such as MiraLAX, Citrucel, etc. preferably with a meal 3.  2 stool softeners a day 4.  Milk of magnesia or magnesium tablets if needed  labs are normal then RX propranolol  for tremor.   Disc risk in DM.   For tremor continue propranolol  1-4  tablets twice daily as needed for tremor  And also if ammonia normal then suggest NAC for cog concerns   (NAC) N-Acetylcysteine 2 of the  600 mg capsules daily to help with mild cognitive problems.  It can be combined with a B-complex vitamin as the B-12 and folate  which can sometimes enhance the effect.  Given [facility] rec trial L-arginine if he wants to pursue but not proven in humans.  Disc study published in the summer.  He agrees.   FU 2 mos  Lorene Macintosh, MD, DFAPA   Please see After Visit Summary for patient specific instructions.  Future Appointments  Date Time Provider Department Center  09/22/2024 10:00 AM Kdeiss, Renda [facility] None  10/06/2024 11:00 AM Cottle, Lorene KANDICE Raddle., MD [facility] None      No orders of the defined types were placed in this encounter.   -------------------------------

## 2024-09-22 ENCOUNTER — Ambulatory Visit (INDEPENDENT_AMBULATORY_CARE_PROVIDER_SITE_OTHER): Payer: Self-pay | Admitting: Psychology

## 2024-09-22 DIAGNOSIS — F0670 Mild neurocognitive disorder due to known physiological condition without behavioral disturbance: Secondary | ICD-10-CM

## 2024-09-22 NOTE — Progress Notes (Signed)
" ° °  NEUROPSYCHOLOGY FEEDBACK SESSION [place]  [place] Department of Neurology  Date of Feedback Session: 09/22/2024  REASON FOR REFERRAL   Adam Bowman is a 59 year old, right-handed, White male with 16 years of formal education. He was referred for neuropsychological evaluation by his neurologist, Asberry Tat, D.O., to assess current neurocognitive functioning, document potential cognitive deficits, and assist with treatment planning. This is his first neuropsychological evaluation.  FEEDBACK   Patient completed a comprehensive neuropsychological evaluation on 09/14/2024. Please refer to that encounter for the full report and recommendations. Briefly, results indicated variability across all cognitive domains, with the most consistent deficits being observed across tasks of executive functioning. At this time, findings are most supportive of a diagnosis of mild neurocognitive disorder (mild cognitive impairment). Some observed cognitive weaknesses may be related to the patients history of essential tremor and mild cerebrovascular changes. He also reports significant symptoms of depression and anxiety and difficulty adjusting to recent health changes, all of which may exacerbate cognitive difficulties. His wifes report of psychomotor slowing remains a nonspecific finding with multiple possible neurological or psychiatric etiologies.   Today, the patient and his wife presented by phone due to weather-related transportation safety concerns. They participated from their residence, while I conducted the session from my office. The content of the session focused on reviewing the results of his neuropsychological evaluation. They were given the opportunity to ask questions, which were addressed, and were encouraged to reach out should additional questions arise. They were offered a mailed copy of the report but elected to access it through the electronic medical record  instead.  DISPOSITION   No follow-up neuropsychological testing was scheduled at this time. Please feel free to refer the patient for repeated evaluation if he shows a significant change in neurocognitive status.  SERVICE   This feedback session was conducted by Renda Beckwith, Psy.D. [place] of [id number] (35 minutes) was billed for Dr. Beckwith' time spent in preparing, conducting, and documenting the current feedback session.  This report was generated using voice recognition software. While this document has been carefully reviewed, transcription errors may be present. I apologize in advance for any inconvenience. Please contact me if further clarification is needed.  "

## 2024-10-06 ENCOUNTER — Ambulatory Visit: Admitting: Psychiatry

## 2024-10-27 ENCOUNTER — Ambulatory Visit: Admitting: Psychiatry
